# Patient Record
Sex: Female | Born: 1985 | Race: Black or African American | Hispanic: No | Marital: Single | Smoking: Never smoker
Health system: Southern US, Community
[De-identification: ages and names within clinical notes are randomized; demographics above are authoritative.]

## PROBLEM LIST (undated history)

## (undated) DIAGNOSIS — D649 Anemia, unspecified: Secondary | ICD-10-CM

## (undated) DIAGNOSIS — D219 Benign neoplasm of connective and other soft tissue, unspecified: Secondary | ICD-10-CM

## (undated) DIAGNOSIS — L309 Dermatitis, unspecified: Secondary | ICD-10-CM

## (undated) HISTORY — DX: Anemia, unspecified: D64.9

---

## 2006-02-06 ENCOUNTER — Emergency Department (HOSPITAL_COMMUNITY): Admission: EM | Admit: 2006-02-06 | Discharge: 2006-02-06 | Payer: Self-pay | Admitting: Emergency Medicine

## 2006-05-29 ENCOUNTER — Inpatient Hospital Stay (HOSPITAL_COMMUNITY): Admission: AD | Admit: 2006-05-29 | Discharge: 2006-05-29 | Payer: Self-pay | Admitting: Gynecology

## 2006-06-04 ENCOUNTER — Other Ambulatory Visit: Admission: RE | Admit: 2006-06-04 | Discharge: 2006-06-04 | Payer: Self-pay | Admitting: Obstetrics and Gynecology

## 2006-07-08 ENCOUNTER — Ambulatory Visit (HOSPITAL_COMMUNITY): Admission: RE | Admit: 2006-07-08 | Discharge: 2006-07-08 | Payer: Self-pay | Admitting: Obstetrics and Gynecology

## 2006-11-01 ENCOUNTER — Inpatient Hospital Stay (HOSPITAL_COMMUNITY): Admission: AD | Admit: 2006-11-01 | Discharge: 2006-11-04 | Payer: Self-pay | Admitting: Obstetrics & Gynecology

## 2006-11-02 ENCOUNTER — Encounter (INDEPENDENT_AMBULATORY_CARE_PROVIDER_SITE_OTHER): Payer: Self-pay | Admitting: Obstetrics and Gynecology

## 2006-12-15 ENCOUNTER — Other Ambulatory Visit: Admission: RE | Admit: 2006-12-15 | Discharge: 2006-12-15 | Payer: Self-pay | Admitting: Obstetrics and Gynecology

## 2008-05-27 IMAGING — US US OB LIMITED
1 series · 14 of 14 positions shown · non-contrast
Comparison: none

OBSTETRICAL ULTRASOUND:

 This ultrasound exam was performed in the [HOSPITAL] Ultrasound Department.  The OB US report was generated in the AS system, and faxed to the ordering physician.  This report is also available in [REDACTED] PACS.

[Series 1: us ob limited · 0.26mm/px · 14 of 14 slices shown]
[im 1/14]
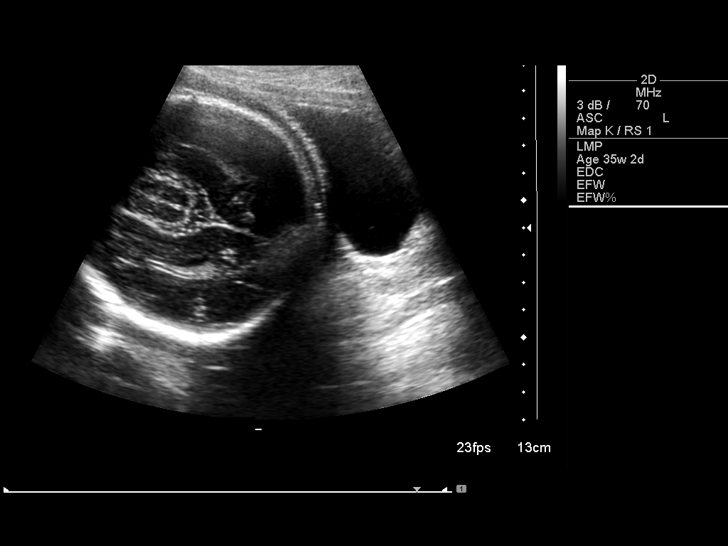
[im 2/14]
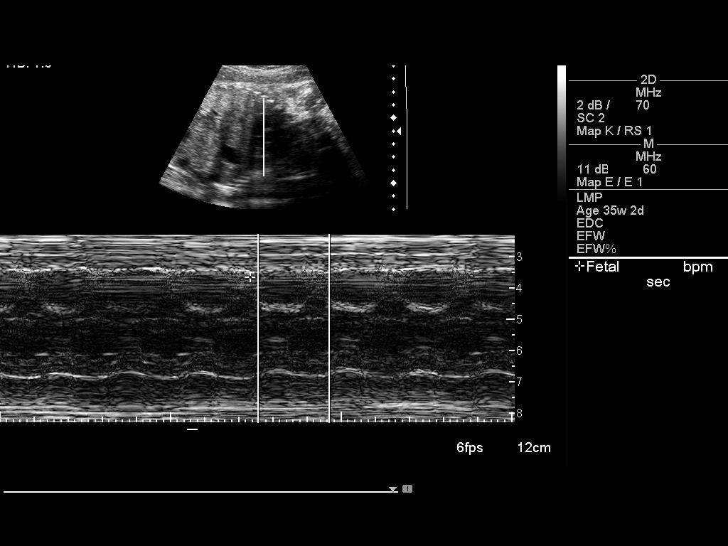
[im 3/14]
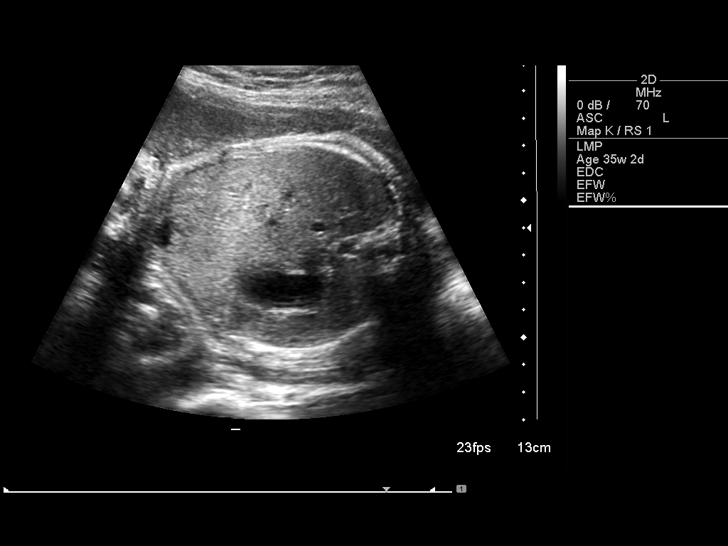
[im 4/14]
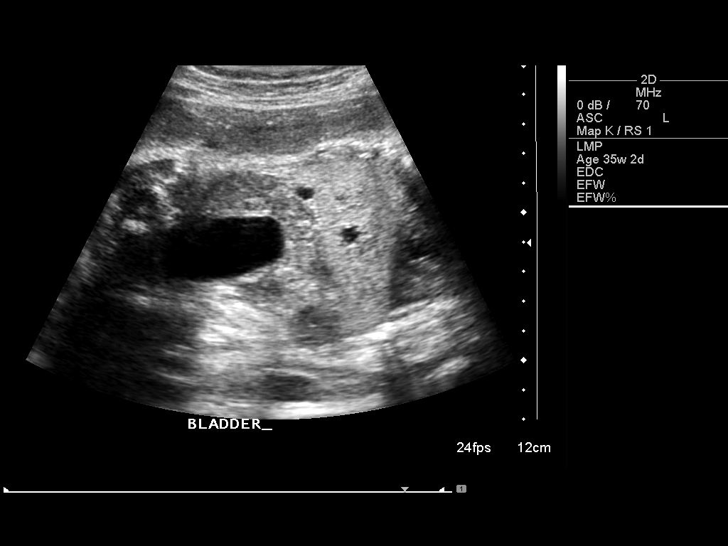
[im 5/14]
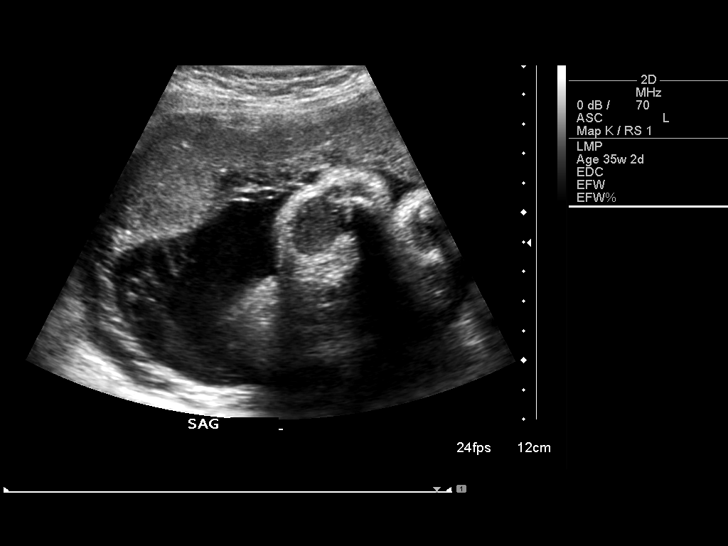
[im 6/14]
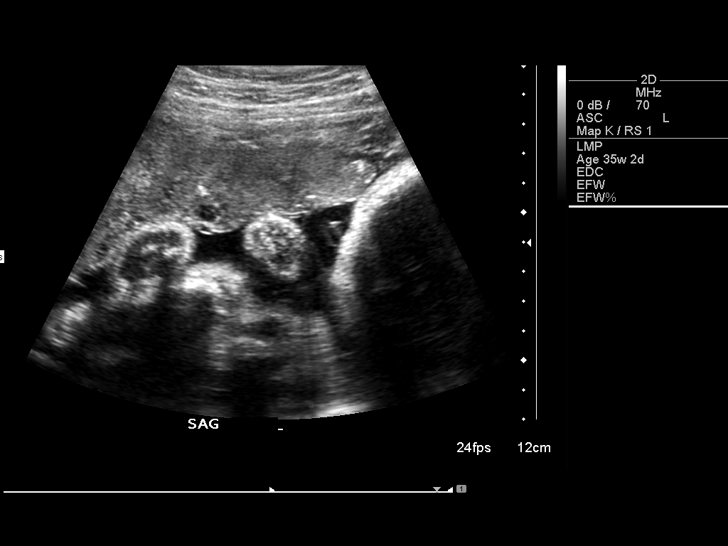
[im 7/14]
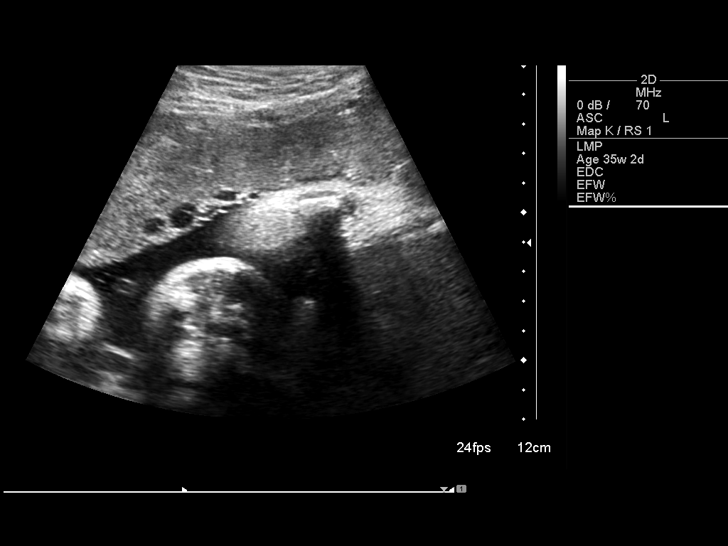
[im 8/14]
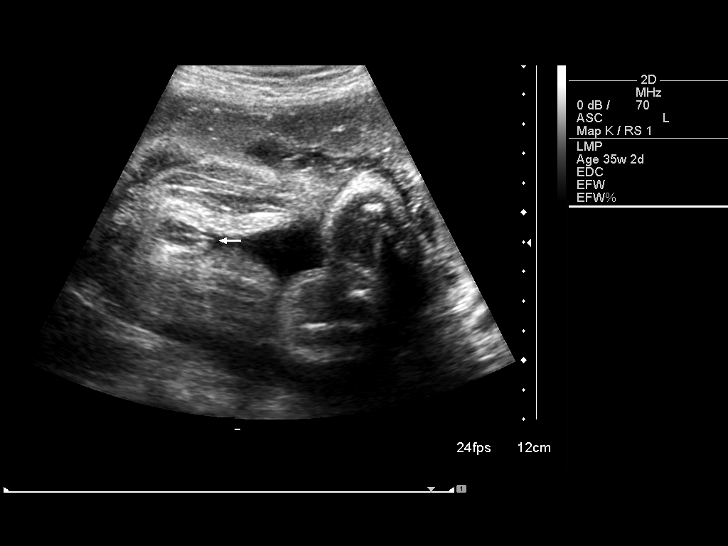
[im 9/14]
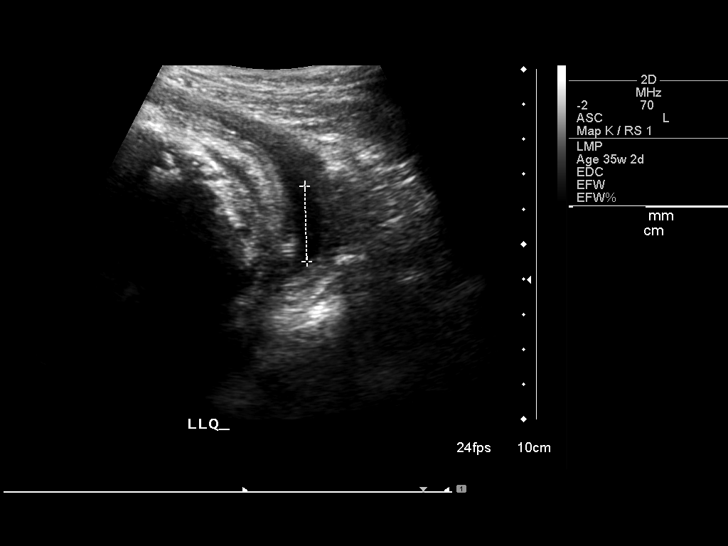
[im 10/14]
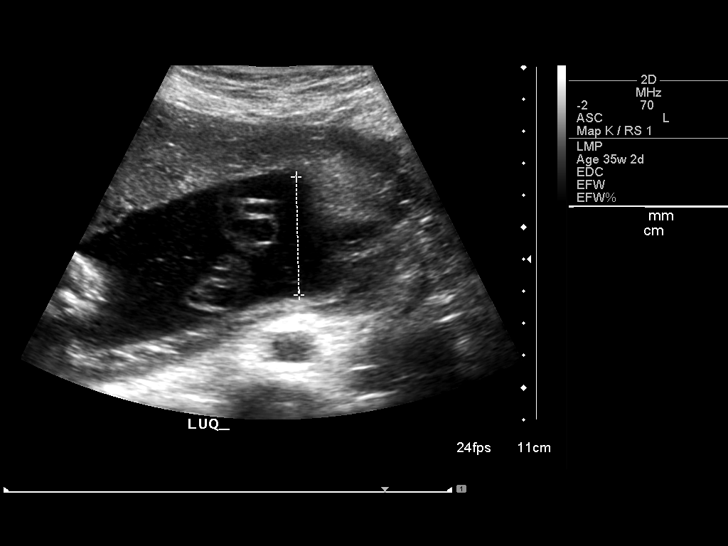
[im 11/14]
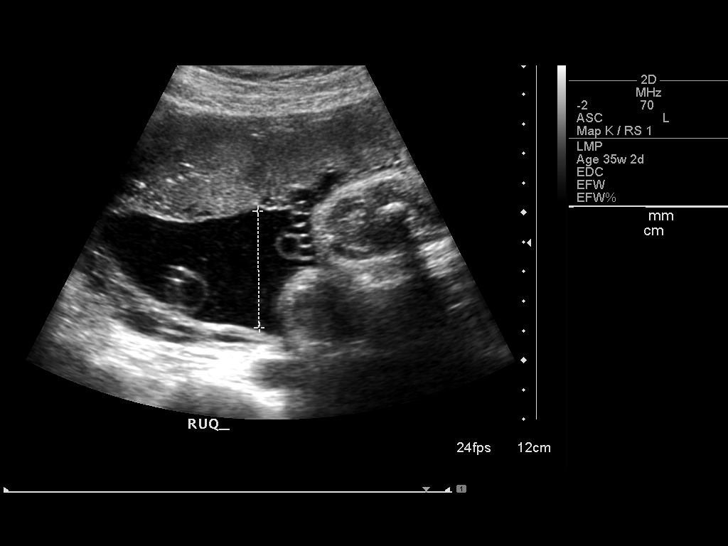
[im 12/14]
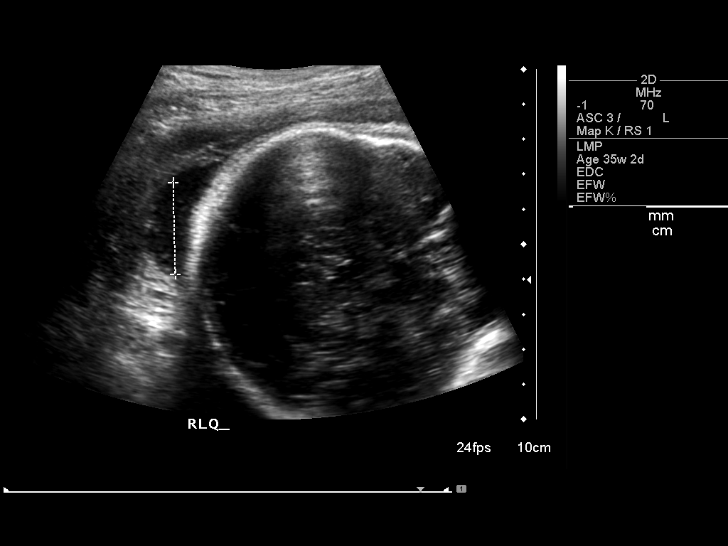
[im 13/14]
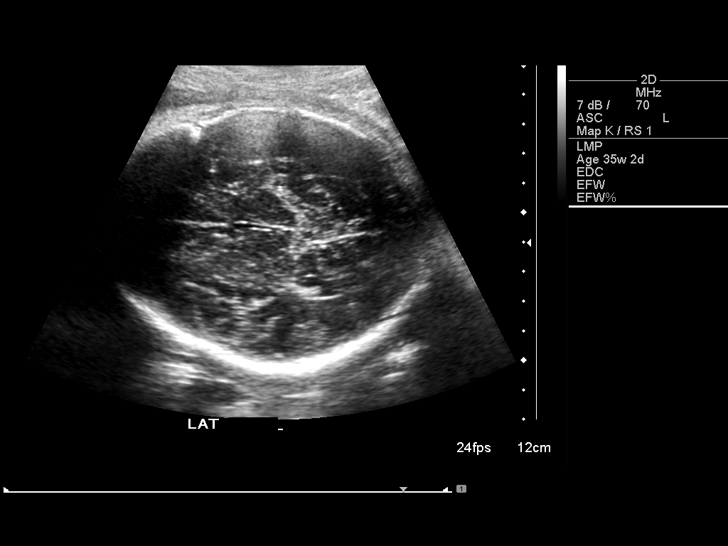
[im 14/14]
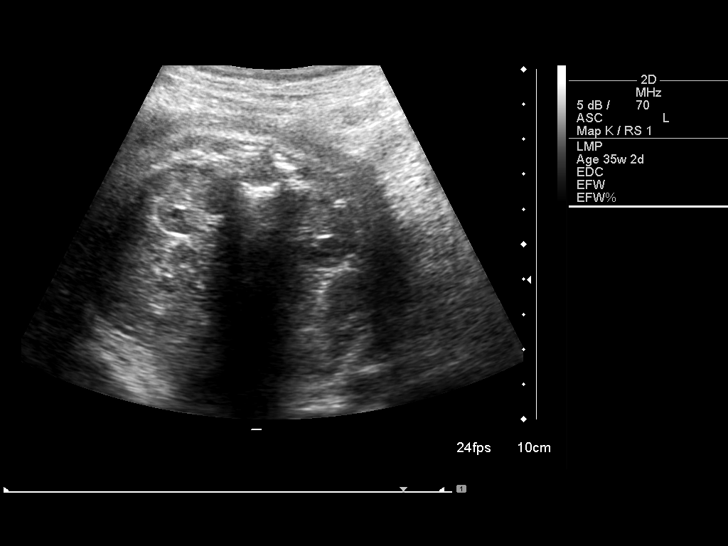

[14 of 14 positions shown; findings below may reference images not displayed]

IMPRESSION: See AS Obstetric US report.

## 2010-07-13 NOTE — Discharge Summary (Signed)
NAME:  Shannon Carpenter, Shannon Carpenter                ACCOUNT NO.:  [id number]   MEDICAL RECORD NO.:  [id number]          PATIENT TYPE:   LOCATION:                                FACILITY:  [facility]   PHYSICIAN:  James A. Ashley Royalty, M.D.     DATE OF BIRTH:   DATE OF ADMISSION:  11/01/2006  DATE OF DISCHARGE:  11/04/2006                               DISCHARGE SUMMARY   DISCHARGE DIAGNOSES:  1. Intrauterine pregnancy at 35 weeks, 1 day gestation, delivered.  2. Spontaneous rupture of membranes.  3. Preterm labor.   OPERATIONS AND SPECIAL PROCEDURES:  OB delivery with repair of second  degree laceration.   CONSULTATIONS:  None.   DISCHARGE MEDICATIONS:  Motrin 600 mg.   HISTORY OF PRESENT ILLNESS:  This is a 25 year old primigravida at 62  weeks, 1 day gestation.  Prenatal care was complicated by first  trimester bleeding which resolved.  The patient presented to maternity  admissions complaining of rupture of membranes at approximately 11 a.m.  preceded by contractions.  GBS status was uncertain.  Examination by the  nurse in maternity admissions revealed fingertip dilatation, minimal  effacement, and high station.  She did confirm ferning.   HOSPITAL COURSE:  The patient was admitted to [facility]  [place].  Admission laboratory studies were drawn.  The patient was  given antibiotic prophylaxis.  On November 01, 2006 at 5 p.m. the  patient was noted to be loose fingertip dilatation.  By 10:35 p.m. she  was noted to be 4 cm dilated.  She was on Pitocin.  On November 02, 2006  I was called for delivery at approximately 12:22 a.m..  I arrived  approximately 12:37 a.m., and the infant had been delivered by teaching  service.  Delivery was accomplished by Dr. Penne Lash with the vacuum  extractor secondary to fetal bradycardia and nonreassuring fetal heart  tracing.  Dr. Penne Lash repaired a second degree laceration as well.  The  infant was a 4 pound 4 ounce female with Apgars of 7 at 1 minute and  8  at 5 minutes.  Sent to newborn nursery.  Arterial cord pH was 7.35.  The  patient's postpartum course was benign.  Her child was taken to the  neonatal intensive care nursery on November 03, 2006.  The patient was  felt to be a candidate for discharge on November 04, 2006 and was  discharged home afebrile and in satisfactory condition.   DISPOSITION:  The patient is to return to [facility] in 4-6 weeks for postpartum evaluation.      James A. Ashley Royalty, M.D.  Electronically Signed     JAM/MEDQ  D:  12/30/2006  T:  12/31/2006  Job:  [id number]

## 2010-12-07 LAB — CBC
Hemoglobin: 11 — ABNORMAL LOW
Hemoglobin: 12
MCHC: 34.3
MCHC: 34.9
MCV: 93
Platelets: 209
RBC: 3.45 — ABNORMAL LOW
RDW: 13.1

## 2010-12-07 LAB — TYPE AND SCREEN: ABO/RH(D): O POS

## 2011-01-03 ENCOUNTER — Encounter: Payer: Self-pay | Admitting: Emergency Medicine

## 2011-01-03 ENCOUNTER — Emergency Department (HOSPITAL_COMMUNITY)
Admission: EM | Admit: 2011-01-03 | Discharge: 2011-01-03 | Disposition: A | Discharge status: Home / Self Care | Payer: Self-pay | Attending: Emergency Medicine | Admitting: Emergency Medicine

## 2011-01-03 DIAGNOSIS — R51 Headache: Secondary | ICD-10-CM | POA: Insufficient documentation

## 2011-01-03 LAB — POCT PREGNANCY, URINE: Preg Test, Ur: NEGATIVE

## 2011-01-03 MED ORDER — CETIRIZINE-PSEUDOEPHEDRINE ER 5-120 MG PO TB12: 1.0000 | ORAL_TABLET | Freq: Every day | ORAL | Status: DC

## 2011-01-03 MED ORDER — LORATADINE 10 MG PO TABS: 10.0000 mg | ORAL_TABLET | Freq: Every day | ORAL | Status: DC

## 2011-01-03 NOTE — ED Notes (Signed)
Patient with headache, starting around 1900 for last three evenings.  Patient states she does have some nausea.  Patient states that the pain is frontal to top of head.

## 2011-01-03 NOTE — ED Notes (Signed)
PT. REPORTS HEADACHE "PRESSURE"  , LIGHTHEADEDNESS AND NAUSEA ONSET YESTERDAY -DENIES INJURY . NO FEVER OR CHILLS.

## 2011-01-03 NOTE — ED Provider Notes (Signed)
History     CSN: [id number] Arrival date & time: 01/03/2011  8:17 PM   None     Chief Complaint  Patient presents with  . Headache    (Consider location/radiation/quality/duration/timing/severity/associated sxs/prior treatment) Patient is a 25 y.o. female presenting with headaches. The history is provided by the patient.  Headache  This is a recurrent problem. The current episode started 1 to 2 hours ago. Episode frequency: daily, around 7pm (at end of work day for patient) The problem has been resolved (Pt took medicine pta and pain resolved). The headache is associated with nothing. The pain is located in the frontal region. The pain is moderate. The pain does not radiate. Pertinent negatives include no fever, no chest pressure, no syncope, no shortness of breath, no nausea and no vomiting. Treatments tried: fioricet. The treatment provided significant relief.    History reviewed. No pertinent past medical history.  History reviewed. No pertinent past surgical history.  No family history on file.  History  Substance Use Topics  . Smoking status: Never Smoker   . Smokeless tobacco: Not on file  . Alcohol Use: Yes     OCCASIONAL    OB History    Grav Para Term Preterm Abortions TAB SAB Ect Mult Living                  Review of Systems  Constitutional: Negative for fever.  Respiratory: Positive for cough (dry cough, only in AM). Negative for shortness of breath.   Cardiovascular: Negative for chest pain and syncope.  Gastrointestinal: Negative for nausea, vomiting, abdominal pain and diarrhea.  Neurological: Positive for headaches.  All other systems reviewed and are negative.    Allergies  Review of patient's allergies indicates no known allergies.  Home Medications   Current Outpatient Rx  Name Route Sig Dispense Refill  . BUTALBITAL-APAP-CAFFEINE 50-325-40 MG PO TABS Oral Take 1 tablet by mouth every 4 (four) hours as needed. For migraines       BP 138/79   Pulse 88  Temp(Src) 98 F (36.7 C) (Oral)  Resp 17  SpO2 100%  Physical Exam  Nursing note and vitals reviewed. Constitutional: She is oriented to person, place, and time. She appears well-developed and well-nourished. No distress.  HENT:  Head: Normocephalic and atraumatic.  Right Ear: External ear normal.  Left Ear: External ear normal.  Mouth/Throat: Oropharynx is clear and moist.       Cobblestoning of posterior pharynx  Eyes: EOM are normal. Pupils are equal, round, and reactive to light.  Neck: Normal range of motion.  Cardiovascular: Normal rate and normal heart sounds.   Pulmonary/Chest: Effort normal and breath sounds normal. No respiratory distress.  Abdominal: Soft. She exhibits no distension. There is no tenderness.  Musculoskeletal: Normal range of motion.  Neurological: She is alert and oriented to person, place, and time.  Skin: Skin is warm and dry.    ED Course  Procedures (including critical care time)   Labs Reviewed  POCT PREGNANCY, URINE   No results found.   1. Headache       MDM  9:12 PM PT seen and examined. Pt with similar headache to what she had about a year ago. She was given Fioricet which does relieve the pain, but it comes back about 24 hours later every day. Concern for possible allergic component as pt with morning cough and cobblestoning of throat. Will start on allergy meds and advise follow up with neurology if no improvement. No  fevers or neck stiffness, so doubt meningitis. Concern for possible cluster headaches, but pain is bilateral. Concern for tension headaches, but do not appear to respond to NSAIDS.         Daleen Bo 01/03/11 2335

## 2011-01-03 NOTE — ED Provider Notes (Signed)
History     CSN: [id number] Arrival date & time: 01/03/2011  8:17 PM   None     Chief Complaint  Patient presents with  . Headache    (Consider location/radiation/quality/duration/timing/severity/associated sxs/prior treatment) HPI  History reviewed. No pertinent past medical history.  History reviewed. No pertinent past surgical history.  No family history on file.  History  Substance Use Topics  . Smoking status: Never Smoker   . Smokeless tobacco: Not on file  . Alcohol Use: Yes     OCCASIONAL    OB History    Grav Para Term Preterm Abortions TAB SAB Ect Mult Living                  Review of Systems  Allergies  Review of patient's allergies indicates no known allergies.  Home Medications   Current Outpatient Rx  Name Route Sig Dispense Refill  . BUTALBITAL-APAP-CAFFEINE 50-325-40 MG PO TABS Oral Take 1 tablet by mouth every 4 (four) hours as needed. For migraines       BP 138/79  Pulse 88  Temp(Src) 98 F (36.7 C) (Oral)  Resp 17  SpO2 100%  Physical Exam  ED Course  Procedures (including critical care time)  Labs Reviewed - No data to display No results found.   No diagnosis found.    MDM  I saw and evaluated the patient, reviewed the resident's note and I agree with the findings and plan.         Tata Timmins A. Patrica Duel, MD 01/03/11 2110

## 2011-01-06 NOTE — ED Provider Notes (Signed)
I saw and evaluated the patient, reviewed the resident's note and I agree with the findings and plan.  Camar Guyton A. Patrica Duel, MD 01/06/11 [phone]

## 2012-06-30 ENCOUNTER — Ambulatory Visit: Payer: Self-pay | Admitting: Emergency Medicine

## 2012-06-30 VITALS — BP 128/84 | HR 118 | Temp 99.2°F | Resp 17 | Ht 63.0 in | Wt 173.0 lb

## 2012-06-30 DIAGNOSIS — J029 Acute pharyngitis, unspecified: Secondary | ICD-10-CM

## 2012-06-30 MED ORDER — PENICILLIN V POTASSIUM 500 MG PO TABS: 500.0000 mg | ORAL_TABLET | Freq: Four times a day (QID) | ORAL | Status: DC

## 2012-06-30 NOTE — Patient Instructions (Addendum)
Safer Sex Your caregiver wants you to have this information about the infections that can be transmitted from sexual contact and how to prevent them. The idea behind safer sex is that you can be sexually active, and at the same time reduce the risk of giving or getting a sexually transmitted disease (STD). Every person should be aware of how to prevent him or herself and his or her sex partner from getting an STD. CAUSES OF STDS STDs are transmitted by sharing body fluids, which contain viruses and bacteria. The following fluids all transmit infections during sexual intercourse and sex acts:  Semen.  Saliva.  Urine.  Blood.  Vaginal mucus. Examples of STDs include:  Chlamydia.  Gonorrhea.  Genital herpes.  Hepatitis B.  Human immunodeficiency virus or acquired immunodeficiency syndrome (HIV or AIDS).  Syphilis.  Trichomonas.  Pubic lice.  Human papillomavirus (HPV), which may include:  Genital warts.  Cervical dysplasia.  Cervical cancer (can develop with certain types of HPV). SYMPTOMS  Sexual diseases often cause few or no symptoms until they are advanced, so a person can be infected and spread the infection without knowing it. Some STDs respond to treatment very well. Others, like HIV and herpes, cannot be cured, but are treated to reduce their effects. Specific symptoms include:  Abnormal vaginal discharge.  Irritation or itching in and around the vagina, and in the pubic hair.  Pain during sexual intercourse.  Bleeding during sexual intercourse.  Pelvic or abdominal pain.  Fever.  Growths in and around the vagina.  An ulcer in or around the vagina.  Swollen glands in the groin area. DIAGNOSIS   Blood tests.  Pap test.  Culture test of abnormal vaginal discharge.  A test that applies a solution and examines the cervix with a lighted magnifying scope (colposcopy).  A test that examines the pelvis with a lighted tube, through a small incision  (laparoscopy). TREATMENT  The treatment will depend on the cause of the STD.  Antibiotic treatment by injection, oral, creams, or suppositories in the vagina.  Over-the-counter medicated shampoo, to get rid of pubic lice.  Removing or treating growths with medicine, freezing, burning (electrocautery), or surgery.  Surgery treatment for HPV of the cervix.  Supportive medicines for herpes, HIV, AIDS, and hepatitis. Being careful cannot eliminate all risk of infection, but sex can be made much safer. Safe sexual practices include body massage and gentle touching. Masturbation is safe, as long as body fluids do not contact skin that has sores or cuts. Dry kissing and oral sex on a man wearing a latex condom or on a woman wearing a female condom is also safe. Slightly less safe is intercourse while the man wears a latex condom or wet kissing. It is also safer to have one sex partner that you know is not having sex with anyone else. LENGTH OF ILLNESS An STD might be treated and cured in a week, sometimes a month, or more. And it can linger with symptoms for many years. STDs can also cause damage to the female organs. This can cause chronic pain, infertility, and recurrence of the STD, especially herpes, hepatitis, HIV, and HPV. HOME CARE INSTRUCTIONS AND PREVENTION  Alcohol and recreational drugs are often the reason given for not practicing safer sex. These substances affect your judgment. Alcohol and recreational drugs can also impair your immune system, making you more vulnerable to disease.  Do not engage in risky and dangerous sexual practices, including:  Vaginal or anal sex without a  condom.  Oral sex on a man without a condom.  Oral sex on a woman without a female condom.  Using saliva to lubricate a condom.  Any other sexual contact in which body fluids or blood from one partner contact the other partner.  You should use only latex condoms for men and water soluble lubricants.  Petroleum based lubricants or oils used to lubricate a condom will weaken the condom and increase the chance that it will break.  Think very carefully before having sex with anyone who is high risk for STDs and HIV. This includes IV drug users, people with multiple sexual partners, or people who have had an STD, or a positive hepatitis or HIV blood test.  Remember that even if your partner has had only one previous partner, their previous partner might have had multiple partners. If so, you are at high risk of being exposed to an STD. You and your sex partner should be the only sex partners with each other, with no one else involved.  A vaccine is available for hepatitis B and HPV through your caregiver or the Public Health Department. Everyone should be vaccinated with these vaccines.  Avoid risky sex practices. Sex acts that can break the skin make you more likely to get an STD. SEEK MEDICAL CARE IF:   If you think you have an STD, even if you do not have any symptoms. Contact your caregiver for evaluation and treatment, if needed.  You think or know your sex partner has acquired an STD.  You have any of the symptoms mentioned above. Document Released: 03/21/2004 Document Revised: 05/06/2011 Document Reviewed: 01/11/2009 [facility] Patient Information 2013 [place], [place]. Strep Infections Streptococcal (strep) infections are caused by streptococcal germs (bacteria). Strep infections are very contagious. Strep infections can occur in:  Ears.  The nose.  The throat.  Sinuses.  Skin.  Blood.  Lungs.  Spinal fluid.  Urine. Strep throat is the most common bacterial infection in children. The symptoms of a Strep infection usually get better in 2 to 3 days after starting medicine that kills germs (antibiotics). Strep is usually not contagious after 36 to 48 hours of antibiotic treatment. Strep infections that are not treated can cause serious complications. These include gland  infections, throat abscess, rheumatic fever and kidney disease. DIAGNOSIS  The diagnosis of strep is made by:  A culture for the strep germ. TREATMENT  These infections require oral antibiotics for a full 10 days, an antibiotic shot or antibiotics given into the vein (intravenous, IV). HOME CARE INSTRUCTIONS   Be sure to finish all antibiotics even if feeling better.  Only take over-the-counter medicines for pain, discomfort and or fever, as directed by your caregiver.  Close contacts that have a fever, sore throat or illness symptoms should see their caregiver right away.  You or your child may return to work, school or daycare if the fever and pain are better in 2 to 3 days after starting antibiotics. SEEK MEDICAL CARE IF:   You or your child has an oral temperature above 102 F (38.9 C).  Your baby is older than 3 months with a rectal temperature of 100.5 F (38.1 C) or higher for more than 1 day.  You or your child is not better in 3 days. SEEK IMMEDIATE MEDICAL CARE IF:   You or your child has an oral temperature above 102 F (38.9 C), not controlled by medicine.  Your baby is older than 3 months with a rectal temperature  of 102 F (38.9 C) or higher.  Your baby is 29 months old or younger with a rectal temperature of 100.4 F (38 C) or higher.  There is a spreading rash.  There is difficulty swallowing or breathing.  There is increased pain or swelling. Document Released: 03/21/2004 Document Revised: 05/06/2011 Document Reviewed: 12/28/2008 [facility] Patient Information 2013 [place], [place].

## 2012-06-30 NOTE — Progress Notes (Signed)
[facility] [address], [place] [address] [phone]  Date:  06/30/2012   Name:  Shannon Carpenter   DOB:  1985-09-12   MRN:  [id number]  PCP:  No PCP Per Patient    Chief Complaint: Exposure to STD and Sore Throat   History of Present Illness:  Shannon Carpenter is a 27 y.o. very pleasant female patient who presents with the following:  Says that after having oral sex over the past two weeks she found that she has a sore throat and looked on the internet and thinks she has oropharyngeal gonorrhea.  No fever or chills.  No history of STD in past.  No vaginal discharge or dyspareunia.  No improvement with over the counter medications or other home remedies. Denies other complaint or health concern today.   There are no active problems to display for this patient.   Past Medical History  Diagnosis Date  . Anemia     History reviewed. No pertinent past surgical history.  History  Substance Use Topics  . Smoking status: Never Smoker   . Smokeless tobacco: Not on file  . Alcohol Use: Yes     Comment: OCCASIONAL    History reviewed. No pertinent family history.  No Known Allergies  Medication list has been reviewed and updated.  No current outpatient prescriptions on file prior to visit.   No current facility-administered medications on file prior to visit.    Review of Systems:  As per HPI, otherwise negative.    Physical Examination: Filed Vitals:   06/30/12 1831  BP: 128/84  Pulse: 118  Temp: 99.2 F (37.3 C)  Resp: 17   Filed Vitals:   06/30/12 1831  Height: 5\' 3"  (1.6 m)  Weight: 173 lb (78.472 kg)   Body mass index is 30.65 kg/(m^2). Ideal Body Weight: Weight in (lb) to have BMI = 25: 140.8  GEN: WDWN, NAD, Non-toxic, A & O x 3 HEENT: Atraumatic, Normocephalic. Neck supple. No masses, No LAD.  Exudative tonsillitis Ears and Nose: No external deformity. CV: RRR, No M/G/R. No JVD. No thrill. No extra heart sounds. PULM: CTA  B, no wheezes, crackles, rhonchi. No retractions. No resp. distress. No accessory muscle use. ABD: S, NT, ND, +BS. No rebound. No HSM. EXTR: No c/c/e NEURO Normal gait.  PSYCH: Normally interactive. Conversant. Not depressed or anxious appearing.  Calm demeanor.    Assessment and Plan: Tonsillitis Pen v k   Signed,  Phillips Odor, MD

## 2012-07-03 ENCOUNTER — Telehealth: Payer: Self-pay

## 2012-07-03 NOTE — Telephone Encounter (Signed)
She was advised of negative results.

## 2012-07-03 NOTE — Telephone Encounter (Signed)
Patient calling back for lab results, says she was just on the phone with someone in lab. Please call back at: [phone]

## 2012-07-04 LAB — GONOCOCCUS CULTURE: Organism ID, Bacteria: NO GROWTH

## 2017-08-25 ENCOUNTER — Other Ambulatory Visit: Payer: Self-pay | Admitting: Obstetrics & Gynecology

## 2017-09-03 NOTE — Patient Instructions (Addendum)
Your procedure is scheduled on: Monday, July 22  Enter through the Main Entrance of [facility] at: 11:15 am  Pick up the phone at the desk and dial [phone].  Call this number if you have problems the morning of surgery: [phone].  Remember: Do NOT eat food or Do NOT drink clear liquids (including water) after midnight Sunday   Take these medicines the morning of surgery with a SIP OF WATER: None  Bring inhaler with you on day of surgery.  Brush your teeth on the morning of surgery.  Do Not smoke on the day of surgery.  Stop herbal medications and supplements at this time.  Do NOT wear jewelry (body piercing), metal hair clips/bobby pins, make-up, or nail polish. Do NOT wear lotions, powders, or perfumes.  You may wear deoderant. Do NOT shave for 48 hours prior to surgery. Do NOT bring valuables to the hospital. Contacts, dentures, or bridgework may not be worn into surgery.  Leave suitcase in car.  After surgery it may be brought to your room.    For patients admitted to the hospital, checkout time is 11:00 AM the day of discharge.

## 2017-09-12 ENCOUNTER — Encounter (HOSPITAL_COMMUNITY): Payer: Self-pay

## 2017-09-12 ENCOUNTER — Other Ambulatory Visit: Payer: Self-pay

## 2017-09-12 ENCOUNTER — Encounter (HOSPITAL_COMMUNITY)
Admission: RE | Admit: 2017-09-12 | Discharge: 2017-09-12 | Disposition: A | Discharge status: Home / Self Care | Payer: BC Managed Care – PPO | Source: Ambulatory Visit | Attending: Obstetrics & Gynecology | Admitting: Obstetrics & Gynecology

## 2017-09-12 DIAGNOSIS — Z01812 Encounter for preprocedural laboratory examination: Secondary | ICD-10-CM | POA: Insufficient documentation

## 2017-09-12 LAB — BASIC METABOLIC PANEL
ANION GAP: 10 (ref 5–15)
BUN: 12 mg/dL (ref 6–20)
CO2: 24 mmol/L (ref 22–32)
Calcium: 9.3 mg/dL (ref 8.9–10.3)
Chloride: 102 mmol/L (ref 98–111)
Creatinine, Ser: 0.69 mg/dL (ref 0.44–1.00)
GFR calc Af Amer: >60 mL/min (ref >60)
Glucose, Bld: 96 mg/dL (ref 70–99)
POTASSIUM: 4.5 mmol/L (ref 3.5–5.1)
SODIUM: 136 mmol/L (ref 135–145)

## 2017-09-12 LAB — CBC
HEMATOCRIT: 35.7 % — AB (ref 36.0–46.0)
HEMOGLOBIN: 11.6 g/dL — AB (ref 12.0–15.0)
MCH: 27.8 pg (ref 26.0–34.0)
MCHC: 32.5 g/dL (ref 30.0–36.0)
MCV: 85.4 fL (ref 78.0–100.0)
Platelets: 304 10*3/uL (ref 150–400)
RBC: 4.18 MIL/uL (ref 3.87–5.11)
RDW: 18.9 % — AB (ref 11.5–15.5)
WBC: 4.3 10*3/uL (ref 4.0–10.5)

## 2017-09-12 LAB — TYPE AND SCREEN
ABO/RH(D): O POS
Antibody Screen: NEGATIVE

## 2017-09-15 ENCOUNTER — Inpatient Hospital Stay (HOSPITAL_COMMUNITY): Payer: BC Managed Care – PPO | Admitting: Certified Registered Nurse Anesthetist

## 2017-09-15 ENCOUNTER — Observation Stay (HOSPITAL_COMMUNITY)
Admission: RE | Admit: 2017-09-15 | Discharge: 2017-09-16 | Disposition: A | Discharge status: Home / Self Care | Payer: BC Managed Care – PPO | Source: Ambulatory Visit | Attending: Obstetrics & Gynecology | Admitting: Obstetrics & Gynecology

## 2017-09-15 ENCOUNTER — Other Ambulatory Visit: Payer: Self-pay

## 2017-09-15 ENCOUNTER — Encounter (HOSPITAL_COMMUNITY)
Admission: RE | Disposition: A | Discharge status: Home / Self Care | Payer: Self-pay | Source: Ambulatory Visit | Attending: Obstetrics & Gynecology

## 2017-09-15 DIAGNOSIS — D649 Anemia, unspecified: Secondary | ICD-10-CM | POA: Insufficient documentation

## 2017-09-15 DIAGNOSIS — D259 Leiomyoma of uterus, unspecified: Secondary | ICD-10-CM | POA: Insufficient documentation

## 2017-09-15 DIAGNOSIS — D252 Subserosal leiomyoma of uterus: Secondary | ICD-10-CM | POA: Diagnosis not present

## 2017-09-15 DIAGNOSIS — Z79899 Other long term (current) drug therapy: Secondary | ICD-10-CM | POA: Diagnosis not present

## 2017-09-15 DIAGNOSIS — Z9889 Other specified postprocedural states: Secondary | ICD-10-CM

## 2017-09-15 HISTORY — PX: MYOMECTOMY: SHX85

## 2017-09-15 LAB — PREGNANCY, URINE: Preg Test, Ur: NEGATIVE

## 2017-09-15 SURGERY — MYOMECTOMY, ABDOMINAL APPROACH
Anesthesia: General | Site: Abdomen

## 2017-09-15 MED ORDER — CEFAZOLIN SODIUM-DEXTROSE 2-4 GM/100ML-% IV SOLN
INTRAVENOUS | Status: AC
  Filled 2017-09-15: qty 100

## 2017-09-15 MED ORDER — SCOPOLAMINE 1 MG/3DAYS TD PT72
MEDICATED_PATCH | TRANSDERMAL | Status: AC
  Administered 2017-09-15: 1.5 mg via TRANSDERMAL
  Filled 2017-09-15: qty 1

## 2017-09-15 MED ORDER — ROCURONIUM BROMIDE 100 MG/10ML IV SOLN
INTRAVENOUS | Status: DC | PRN
  Administered 2017-09-15: 60 mg via INTRAVENOUS

## 2017-09-15 MED ORDER — MENTHOL 3 MG MT LOZG: 1.0000 | LOZENGE | OROMUCOSAL | Status: DC | PRN

## 2017-09-15 MED ORDER — PROMETHAZINE HCL 25 MG/ML IJ SOLN: 6.2500 mg | INTRAMUSCULAR | Status: DC | PRN

## 2017-09-15 MED ORDER — SODIUM CHLORIDE 0.9% FLUSH: 9.0000 mL | INTRAVENOUS | Status: DC | PRN

## 2017-09-15 MED ORDER — ONDANSETRON HCL 4 MG/2ML IJ SOLN: 4.0000 mg | Freq: Four times a day (QID) | INTRAMUSCULAR | Status: DC | PRN

## 2017-09-15 MED ORDER — NALOXONE HCL 0.4 MG/ML IJ SOLN: 0.4000 mg | INTRAMUSCULAR | Status: DC | PRN

## 2017-09-15 MED ORDER — GLYCOPYRROLATE 0.2 MG/ML IJ SOLN
INTRAMUSCULAR | Status: AC
  Filled 2017-09-15: qty 1

## 2017-09-15 MED ORDER — GLYCOPYRROLATE 0.2 MG/ML IJ SOLN
INTRAMUSCULAR | Status: DC | PRN
  Administered 2017-09-15 (×2): 0.1 mg via INTRAVENOUS

## 2017-09-15 MED ORDER — HYDROMORPHONE HCL 1 MG/ML IJ SOLN
INTRAMUSCULAR | Status: AC
  Filled 2017-09-15: qty 1

## 2017-09-15 MED ORDER — VASOPRESSIN 20 UNIT/ML IV SOLN
INTRAVENOUS | Status: DC | PRN
  Administered 2017-09-15: 60 [IU]

## 2017-09-15 MED ORDER — SODIUM CHLORIDE BACTERIOSTATIC 0.9 % IJ SOLN
INTRAMUSCULAR | Status: DC | PRN
  Administered 2017-09-15: 90 mL

## 2017-09-15 MED ORDER — BUPIVACAINE HCL (PF) 0.25 % IJ SOLN
INTRAMUSCULAR | Status: DC | PRN
  Administered 2017-09-15: 20 mL
  Administered 2017-09-15: 10 mL

## 2017-09-15 MED ORDER — DEXAMETHASONE SODIUM PHOSPHATE 10 MG/ML IJ SOLN
INTRAMUSCULAR | Status: DC | PRN
  Administered 2017-09-15: 10 mg via INTRAVENOUS

## 2017-09-15 MED ORDER — LACTATED RINGERS IV SOLN
INTRAVENOUS | Status: DC
  Administered 2017-09-15 (×2): via INTRAVENOUS
  Administered 2017-09-15: 125 mL/h via INTRAVENOUS

## 2017-09-15 MED ORDER — MIDAZOLAM HCL 2 MG/2ML IJ SOLN: 0.5000 mg | Freq: Once | INTRAMUSCULAR | Status: DC | PRN

## 2017-09-15 MED ORDER — HYDROMORPHONE HCL 1 MG/ML IJ SOLN
0.2500 mg | INTRAMUSCULAR | Status: DC | PRN
  Administered 2017-09-15 (×2): 0.5 mg via INTRAVENOUS

## 2017-09-15 MED ORDER — LIDOCAINE HCL (CARDIAC) PF 100 MG/5ML IV SOSY
PREFILLED_SYRINGE | INTRAVENOUS | Status: AC
  Filled 2017-09-15: qty 5

## 2017-09-15 MED ORDER — ONDANSETRON HCL 4 MG PO TABS: 4.0000 mg | ORAL_TABLET | Freq: Four times a day (QID) | ORAL | Status: DC | PRN

## 2017-09-15 MED ORDER — SUGAMMADEX SODIUM 200 MG/2ML IV SOLN
INTRAVENOUS | Status: DC | PRN
  Administered 2017-09-15: 200 mg via INTRAVENOUS

## 2017-09-15 MED ORDER — KETOROLAC TROMETHAMINE 30 MG/ML IJ SOLN: 30.0000 mg | Freq: Once | INTRAMUSCULAR | Status: DC

## 2017-09-15 MED ORDER — FENTANYL CITRATE (PF) 250 MCG/5ML IJ SOLN
INTRAMUSCULAR | Status: AC
  Filled 2017-09-15: qty 5

## 2017-09-15 MED ORDER — MIDAZOLAM HCL 2 MG/2ML IJ SOLN
INTRAMUSCULAR | Status: AC
  Filled 2017-09-15: qty 2

## 2017-09-15 MED ORDER — SCOPOLAMINE 1 MG/3DAYS TD PT72
1.0000 | MEDICATED_PATCH | Freq: Once | TRANSDERMAL | Status: DC
  Administered 2017-09-15: 1.5 mg via TRANSDERMAL

## 2017-09-15 MED ORDER — SUGAMMADEX SODIUM 200 MG/2ML IV SOLN
INTRAVENOUS | Status: AC
  Filled 2017-09-15: qty 2

## 2017-09-15 MED ORDER — FENTANYL CITRATE (PF) 100 MCG/2ML IJ SOLN
INTRAMUSCULAR | Status: DC | PRN
  Administered 2017-09-15: 50 ug via INTRAVENOUS
  Administered 2017-09-15 (×2): 100 ug via INTRAVENOUS

## 2017-09-15 MED ORDER — ONDANSETRON HCL 4 MG/2ML IJ SOLN
INTRAMUSCULAR | Status: DC | PRN
  Administered 2017-09-15 (×2): 2 mg via INTRAVENOUS

## 2017-09-15 MED ORDER — DIPHENHYDRAMINE HCL 12.5 MG/5ML PO ELIX: 12.5000 mg | ORAL_SOLUTION | Freq: Four times a day (QID) | ORAL | Status: DC | PRN

## 2017-09-15 MED ORDER — MIDAZOLAM HCL 2 MG/2ML IJ SOLN
INTRAMUSCULAR | Status: DC | PRN
  Administered 2017-09-15: 1.5 mg via INTRAVENOUS
  Administered 2017-09-15: 0.5 mg via INTRAVENOUS

## 2017-09-15 MED ORDER — ONDANSETRON HCL 4 MG/2ML IJ SOLN
INTRAMUSCULAR | Status: AC
  Filled 2017-09-15: qty 2

## 2017-09-15 MED ORDER — HYDROMORPHONE 1 MG/ML IV SOLN
INTRAVENOUS | Status: DC
  Administered 2017-09-15: 17:00:00 via INTRAVENOUS
  Administered 2017-09-16: 0.2 mg via INTRAVENOUS
  Administered 2017-09-16: 0.06 mg via INTRAVENOUS
  Filled 2017-09-15: qty 25

## 2017-09-15 MED ORDER — HYDROMORPHONE HCL 1 MG/ML IJ SOLN
INTRAMUSCULAR | Status: DC | PRN
  Administered 2017-09-15 (×2): 1 mg via INTRAVENOUS

## 2017-09-15 MED ORDER — PROPOFOL 10 MG/ML IV BOLUS
INTRAVENOUS | Status: AC
  Filled 2017-09-15: qty 20

## 2017-09-15 MED ORDER — KETOROLAC TROMETHAMINE 30 MG/ML IJ SOLN
INTRAMUSCULAR | Status: DC | PRN
  Administered 2017-09-15: 30 mg via INTRAVENOUS

## 2017-09-15 MED ORDER — DEXAMETHASONE SODIUM PHOSPHATE 10 MG/ML IJ SOLN
INTRAMUSCULAR | Status: AC
  Filled 2017-09-15: qty 1

## 2017-09-15 MED ORDER — IBUPROFEN 600 MG PO TABS
600.0000 mg | ORAL_TABLET | Freq: Four times a day (QID) | ORAL | Status: DC | PRN
Start: 2017-09-15 — End: 2017-09-16
  Administered 2017-09-16: 600 mg via ORAL
  Filled 2017-09-15: qty 1

## 2017-09-15 MED ORDER — LIDOCAINE HCL (CARDIAC) PF 100 MG/5ML IV SOSY
PREFILLED_SYRINGE | INTRAVENOUS | Status: DC | PRN
  Administered 2017-09-15: 40 mg via INTRAVENOUS

## 2017-09-15 MED ORDER — VASOPRESSIN 20 UNIT/ML IV SOLN
INTRAVENOUS | Status: AC
  Filled 2017-09-15: qty 3

## 2017-09-15 MED ORDER — PROPOFOL 10 MG/ML IV BOLUS
INTRAVENOUS | Status: DC | PRN
  Administered 2017-09-15: 200 mg via INTRAVENOUS

## 2017-09-15 MED ORDER — BUPIVACAINE HCL (PF) 0.25 % IJ SOLN
INTRAMUSCULAR | Status: AC
  Filled 2017-09-15: qty 30

## 2017-09-15 MED ORDER — OXYCODONE-ACETAMINOPHEN 5-325 MG PO TABS
1.0000 | ORAL_TABLET | Freq: Four times a day (QID) | ORAL | Status: DC | PRN
  Administered 2017-09-16 (×2): 1 via ORAL
  Filled 2017-09-15 (×2): qty 1

## 2017-09-15 MED ORDER — SODIUM CHLORIDE 0.9 % IJ SOLN
INTRAMUSCULAR | Status: AC
  Filled 2017-09-15: qty 100

## 2017-09-15 MED ORDER — CEFAZOLIN SODIUM-DEXTROSE 2-4 GM/100ML-% IV SOLN
2.0000 g | INTRAVENOUS | Status: AC
  Administered 2017-09-15: 2 g via INTRAVENOUS

## 2017-09-15 MED ORDER — ROCURONIUM BROMIDE 100 MG/10ML IV SOLN
INTRAVENOUS | Status: AC
  Filled 2017-09-15: qty 1

## 2017-09-15 MED ORDER — DIPHENHYDRAMINE HCL 50 MG/ML IJ SOLN: 12.5000 mg | Freq: Four times a day (QID) | INTRAMUSCULAR | Status: DC | PRN

## 2017-09-15 MED ORDER — MEPERIDINE HCL 25 MG/ML IJ SOLN: 6.2500 mg | INTRAMUSCULAR | Status: DC | PRN

## 2017-09-15 MED ORDER — KETOROLAC TROMETHAMINE 30 MG/ML IJ SOLN
INTRAMUSCULAR | Status: AC
  Filled 2017-09-15: qty 1

## 2017-09-15 SURGICAL SUPPLY — 41 items
BARRIER ADHS 3X4 INTERCEED (GAUZE/BANDAGES/DRESSINGS) IMPLANT
BRR ADH 4X3 ABS CNTRL BYND (GAUZE/BANDAGES/DRESSINGS)
CANISTER SUCT 3000ML PPV (MISCELLANEOUS) ×3 IMPLANT
CELLS DAT CNTRL 66122 CELL SVR (MISCELLANEOUS) ×1 IMPLANT
CONT SPEC PATH 64OZ SNAP LID (MISCELLANEOUS) ×3 IMPLANT
DECANTER SPIKE VIAL GLASS SM (MISCELLANEOUS) ×3 IMPLANT
DRAPE CESAREAN BIRTH W POUCH (DRAPES) ×3 IMPLANT
DRSG OPSITE POSTOP 4X10 (GAUZE/BANDAGES/DRESSINGS) ×2 IMPLANT
DURAPREP 26ML APPLICATOR (WOUND CARE) ×3 IMPLANT
ELECT NDL TIP 2.8 STRL (NEEDLE) IMPLANT
ELECT NEEDLE TIP 2.8 STRL (NEEDLE) ×3 IMPLANT
FILTER STRAW FLUID ASPIR (MISCELLANEOUS) IMPLANT
GAUZE SPONGE 4X4 16PLY XRAY LF (GAUZE/BANDAGES/DRESSINGS) ×3 IMPLANT
GLOVE BIO SURGEON STRL SZ7 (GLOVE) ×3 IMPLANT
GLOVE BIOGEL PI IND STRL 7.0 (GLOVE) ×2 IMPLANT
GLOVE BIOGEL PI INDICATOR 7.0 (GLOVE) ×4
GOWN STRL REUS W/TWL LRG LVL3 (GOWN DISPOSABLE) ×9 IMPLANT
NEEDLE HYPO 22GX1.5 SAFETY (NEEDLE) ×6 IMPLANT
NS IRRIG 1000ML POUR BTL (IV SOLUTION) ×3 IMPLANT
PACK ABDOMINAL GYN (CUSTOM PROCEDURE TRAY) ×3 IMPLANT
PAD OB MATERNITY 4.3X12.25 (PERSONAL CARE ITEMS) ×3 IMPLANT
PROTECTOR NERVE ULNAR (MISCELLANEOUS) ×3 IMPLANT
RETRACTOR WND ALEXIS 18 MED (MISCELLANEOUS) IMPLANT
RTRCTR WOUND ALEXIS 18CM MED (MISCELLANEOUS) ×3
STAPLER VISISTAT 35W (STAPLE) IMPLANT
SUT MON AB 2-0 CT1 27 (SUTURE) ×1 IMPLANT
SUT MON AB 2-0 SH 27 (SUTURE) ×9
SUT MON AB 2-0 SH27 (SUTURE) ×1 IMPLANT
SUT MON AB 3-0 SH 27 (SUTURE)
SUT MON AB 3-0 SH27 (SUTURE) IMPLANT
SUT PLAIN 2 0 (SUTURE) ×3
SUT PLAIN ABS 2-0 CT1 27XMFL (SUTURE) IMPLANT
SUT VIC AB 0 CT1 27 (SUTURE) ×21
SUT VIC AB 0 CT1 27XBRD ANBCTR (SUTURE) ×2 IMPLANT
SUT VIC AB 0 CT1 36 (SUTURE) IMPLANT
SUT VIC AB 2-0 CT1 27 (SUTURE) ×3
SUT VIC AB 2-0 CT1 TAPERPNT 27 (SUTURE) IMPLANT
SUT VIC AB 4-0 KS 27 (SUTURE) ×3 IMPLANT
SYR CONTROL 10ML LL (SYRINGE) ×6 IMPLANT
TOWEL OR 17X24 6PK STRL BLUE (TOWEL DISPOSABLE) ×6 IMPLANT
TRAY FOLEY W/BAG SLVR 14FR (SET/KITS/TRAYS/PACK) ×3 IMPLANT

## 2017-09-15 NOTE — Op Note (Signed)
PRE-OPERATIVE DIAGNOSIS:  Uterine Fibroids  [address]  POST-OPERATIVE DIAGNOSIS:  Uterine Fibroids ( specimen weight 952 gm)  PROCEDURE:  Abdominal Myomectomy   SURGEON: Elveria Royals, MD  ASSISTANT:  Artelia Laroche, CNM  ANESTHESIA:  General endotracheal  EBL: 75 cc  IVF: 1300 cc LR   Urine output: urine in foley 75 cc , clear   LOCAL MEDICATIONS USED:  MARCAINE 0.25% 30 cc skin infiltration     SPECIMEN:   4 fibroids to pathology  COUNTS:  YES  PATIENT DISPOSITION:  PACU - hemodynamically stable. Then admit for post-op care.   Delay start of Pharmacological VTE agent (>24hrs) due to surgical blood loss or risk of bleeding: yes  PROCEDURE:   Indication:  Symptomatic uterine fibroids with abdominal pressure, pain, menorrhagia, anemia. After reviewing ultrasound and fibroid depth, decision was made for abdominal myomectomy. Risks and complications of surgery including infection, bleeding, damage to internal organs and other including but not limited to surgery related problems including pneumonia, VTE reviewed. Informed written consent was obtained.   Patient was brought to the operating room with IV running. She received 2 gm Ancef. Underwent general anesthesia without difficulty and was given dorsal supine position, prepped and draped in sterile fashion. Foley catheter was placed. Pfannenstiel incision was made with scalpel and carried down to the underlying fascia with Bovie with excellent hemostasis.  Fascia incised and extended laterally. Fascia grasped with Kocher's and underlying rectus muscles were dissected down. Rectus muscles were separated in midline. Posterior rectus sheath and posterior peritoneum was grasped with mosquitoes and peritoneal entry made and extended.  Large fibroid uterus was palpated. No adhesions noted. Ovaries and tubes were normal. Medium Alexis retractor placed and bowel loops packed with moist lap sponge. Uterus palpated. Large anterior fibroid was  exposed using Deaver to support the uterus. It was encompassing the entire anterior wall. Anterior surface of uterine wall was infiltrated with dilute vasopressin. Vertical incision made on serosa with needle tip cautery and carried down through the myometrium to reach the fibroid. Myometrial edges grasped and fibroid grasped with towel clips and fibroid was enucleated. Hemostasis obtained with cautery. A small subserosal myoma from its inferior edge was removed. Myoma bed was approximated with 0-Vicryl in 2 layers and the serosal layer approximated with 2-0 Monocryl, by baseball stitch. Hemostasis noted. Now posterior left myoma was identified, situated at the uterine cornua, behind proximal end of left fallopian tube and posterior to left utero-ovarian ligament. So decision was made to stay closed to uterine midline. Serosa infiltrated with dilute vasopressin and serosa incised with cautery. Fibroid identified and grasped with towel clamps and enucleated. Hemostasis obtained. Myoma bed was approximated with 0-vicryl in 2 layers and serosa with 2-0 Monocryl- by baseball stitch. Last was a superficial subserosal right posterior myoma. It was enucleated in similar fashion and myoma bed was closed in single layer followed by serosa.  Irrigation done. Hemostasis excellent. Interceed placed on all 3 incisions. Alexis retractor removed. Interceed remained well attached to the incision sites on uterus. .  Peritoneal edges grasped and peritoneum closed with 2-0 Vicryl. Fascia sutured with 0 Vicryl from two ends and met in midline. Subcutaneous layer was deep and closed with 2-0 Plain gut. Skin approximated with 4-0 Vicryl in subcuticular fashion.  Sterile dressing placed.  Sterile Honeycomb dressing placed.  All  Instruments/ lap/ sponges counts were correct x2. No complications. Patient brought to PACU.  Findings discussed with her mother.  Dr Benjie Karvonen was the surgeon for entire case.   -  V.Demerius Podolak, MD

## 2017-09-15 NOTE — H&P (Signed)
Shannon Carpenter is an 32 y.o. female. G1P1 with fibroid uterus with menorrhagia, anemia.  Desires childbearing option.  Nl Pap hx.  Office sono - enlarged fibroid uterus 12x12x10 cm. Largest myoma 7 cm transmural with endometrial displacement, two others  Are 5 cm, right and left subserosal.   Med/Surg/Fam Hx/ allergies reviewed   Patient's last menstrual period was 09/09/2017 (exact date).    Past Medical History:  Diagnosis Date  . Anemia     No past surgical history on file.  No family history on file.  Social History:  reports that she has never smoked. She has never used smokeless tobacco. She reports that she drinks alcohol. She reports that she does not use drugs.  Allergies: No Known Allergies  Medications Prior to Admission  Medication Sig Dispense Refill Last Dose  . cholecalciferol (VITAMIN D) 1000 units tablet Take 1,000 Units by mouth daily.   Past Week at Unknown time  . ferrous sulfate 325 (65 FE) MG EC tablet Take 325 mg by mouth daily.   Past Week at Unknown time  . Garlic 0737 MG TBEC Take 2,000 mg by mouth 2 (two) times daily.   Past Week at Unknown time    ROS none   Physical Exam BP 134/86   Pulse 84   Temp 98.3 F (36.8 C) (Oral)   Resp 16   LMP 09/09/2017 (Exact Date)   SpO2 100%  Physical exam:  A&O x 3, no acute distress. Pleasant HEENT neg, no thyromegaly Lungs CTA bilat CV RRR, S1S2 normal Abdo soft, non tender, non acute Extr no edema/ tenderness Pelvic enlarged uterus 12-14 wk size   CBC    Component Value Date/Time   WBC 4.3 09/12/2017 1050   RBC 4.18 09/12/2017 1050   HGB 11.6 (L) 09/12/2017 1050   HCT 35.7 (L) 09/12/2017 1050   PLT 304 09/12/2017 1050   MCV 85.4 09/12/2017 1050   MCH 27.8 09/12/2017 1050   MCHC 32.5 09/12/2017 1050   RDW 18.9 (H) 09/12/2017 1050    Assessment/Plan: 32 yo here for abdominal myomectomy. After counseling open route was decided due to the central large fibroid dividing the uterus in 2 thin  myometrium sides and submucosal component   Risks/complications of surgery reviewed incl infection, bleeding, damage to internal organs including bladder, bowels, ureters, blood vessels, other risks from anesthesia, VTE and delayed complications of any surgery, complications in future surgery reviewed.  Risk of adhesions, infertility and need for C/s in pregnancy at 36-37 wks reviewed.  Informed written consent obtained.   Elveria Royals 09/15/2017, 11:36 AM

## 2017-09-15 NOTE — Transfer of Care (Signed)
Immediate Anesthesia Transfer of Care Note  Patient: Shannon Carpenter  Procedure(s) Performed: Abdominal MYOMECTOMY (N/A Abdomen)  Patient Location: PACU  Anesthesia Type:General  Level of Consciousness: awake, alert  and oriented  Airway & Oxygen Therapy: Patient Spontanous Breathing and Patient connected to nasal cannula oxygen  Post-op Assessment: Report given to RN and Post -op Vital signs reviewed and stable  Post vital signs: Reviewed and stable  Last Vitals:  Vitals Value Taken Time  BP    Temp    Pulse    Resp    SpO2      Last Pain:  Vitals:   09/15/17 1131  TempSrc: Oral  PainSc: 1       Patients Stated Pain Goal: 5 (50/01/64 2903)  Complications: No apparent anesthesia complications

## 2017-09-15 NOTE — Anesthesia Procedure Notes (Signed)
Procedure Name: Intubation Date/Time: 09/15/2017 1:17 PM Performed by: Annye Asa, MD Pre-anesthesia Checklist: Patient identified, Patient being monitored, Timeout performed, Emergency Drugs available and Suction available Patient Re-evaluated:Patient Re-evaluated prior to induction Oxygen Delivery Method: Circle System Utilized Preoxygenation: Pre-oxygenation with 100% oxygen Induction Type: IV induction Ventilation: Mask ventilation without difficulty Laryngoscope Size: Miller and 2 Grade View: Grade II Tube type: Oral Tube size: 7.0 mm Number of attempts: 1 Airway Equipment and Method: stylet Placement Confirmation: ETT inserted through vocal cords under direct vision,  positive ETCO2 and breath sounds checked- equal and bilateral Secured at: 22 cm Tube secured with: Tape Dental Injury: Teeth and Oropharynx as per pre-operative assessment

## 2017-09-15 NOTE — Anesthesia Preprocedure Evaluation (Signed)
Anesthesia Evaluation  Patient identified by MRN, date of birth, ID band Patient awake    Reviewed: Allergy & Precautions, NPO status , Patient's Chart, lab work & pertinent test results  History of Anesthesia Complications Negative for: history of anesthetic complications  Airway Mallampati: II  TM Distance: >3 FB Neck ROM: Full    Dental  (+) Dental Advisory Given, Teeth Intact   Pulmonary neg pulmonary ROS,    breath sounds clear to auscultation       Cardiovascular negative cardio ROS   Rhythm:Regular Rate:Normal     Neuro/Psych negative neurological ROS  negative psych ROS   GI/Hepatic negative GI ROS, Neg liver ROS,   Endo/Other  obese  Renal/GU negative Renal ROS     Musculoskeletal   Abdominal (+) + obese,   Peds  Hematology negative hematology ROS (+)   Anesthesia Other Findings   Reproductive/Obstetrics                             Anesthesia Physical Anesthesia Plan  ASA: II  Anesthesia Plan: General   Post-op Pain Management:    Induction: Intravenous  PONV Risk Score and Plan: 4 or greater and Scopolamine patch - Pre-op, Dexamethasone and Ondansetron  Airway Management Planned: Oral ETT  Additional Equipment:   Intra-op Plan:   Post-operative Plan: Extubation in OR  Informed Consent: I have reviewed the patients History and Physical, chart, labs and discussed the procedure including the risks, benefits and alternatives for the proposed anesthesia with the patient or authorized representative who has indicated his/her understanding and acceptance.   Dental advisory given  Plan Discussed with: CRNA and Surgeon  Anesthesia Plan Comments: (Plan routine monitors, GETA)        Anesthesia Quick Evaluation

## 2017-09-15 NOTE — Anesthesia Postprocedure Evaluation (Signed)
Anesthesia Post Note  Patient: Shannon Carpenter  Procedure(s) Performed: Abdominal MYOMECTOMY (N/A Abdomen)     Patient location during evaluation: [facility] Anesthesia Type: General Level of consciousness: awake and alert and oriented Pain management: pain level controlled Respiratory status: spontaneous breathing, patient connected to nasal cannula oxygen, nonlabored ventilation and respiratory function stable Cardiovascular status: stable Postop Assessment: no apparent nausea or vomiting and adequate PO intake Anesthetic complications: no    Last Vitals:  Vitals:   09/15/17 1642 09/15/17 1700  BP: 125/78 119/73  Pulse: 84 81  Resp: 16 16  Temp: 36.6 C 36.9 C  SpO2: 95% 98%    Last Pain:  Vitals:   09/15/17 1716  TempSrc:   PainSc: 2    Pain Goal: Patients Stated Pain Goal: 5 (09/15/17 1630)               Willa Rough

## 2017-09-15 NOTE — Anesthesia Postprocedure Evaluation (Signed)
Anesthesia Post Note  Patient: Shannon Carpenter  Procedure(s) Performed: Abdominal MYOMECTOMY (N/A Abdomen)     Patient location during evaluation: PACU Anesthesia Type: General Level of consciousness: awake and alert, oriented and patient cooperative Pain management: pain level controlled Vital Signs Assessment: post-procedure vital signs reviewed and stable Respiratory status: spontaneous breathing, nonlabored ventilation and respiratory function stable Cardiovascular status: blood pressure returned to baseline and stable Postop Assessment: no apparent nausea or vomiting Anesthetic complications: no    Last Vitals:  Vitals:   09/15/17 1621 09/15/17 1628  BP:    Pulse: (!) 102 98  Resp: 16 19  Temp:    SpO2: 96% 95%    Last Pain:  Vitals:   09/15/17 1621  TempSrc:   PainSc: 6    Pain Goal: Patients Stated Pain Goal: 5 (09/15/17 1621)               Seleta Rhymes. Neomia Herbel

## 2017-09-16 ENCOUNTER — Encounter (HOSPITAL_COMMUNITY): Payer: Self-pay | Admitting: Obstetrics & Gynecology

## 2017-09-16 DIAGNOSIS — D252 Subserosal leiomyoma of uterus: Secondary | ICD-10-CM | POA: Diagnosis not present

## 2017-09-16 LAB — CBC
HCT: 30.4 % — ABNORMAL LOW (ref 36.0–46.0)
HEMOGLOBIN: 10.2 g/dL — AB (ref 12.0–15.0)
MCH: 28.7 pg (ref 26.0–34.0)
MCHC: 33.6 g/dL (ref 30.0–36.0)
MCV: 85.6 fL (ref 78.0–100.0)
PLATELETS: 280 10*3/uL (ref 150–400)
RBC: 3.55 MIL/uL — AB (ref 3.87–5.11)
RDW: 18.4 % — ABNORMAL HIGH (ref 11.5–15.5)
WBC: 11 10*3/uL — ABNORMAL HIGH (ref 4.0–10.5)

## 2017-09-16 MED ORDER — IBUPROFEN 600 MG PO TABS: 600.0000 mg | ORAL_TABLET | Freq: Four times a day (QID) | ORAL | 0 refills | Status: DC | PRN

## 2017-09-16 MED ORDER — OXYCODONE-ACETAMINOPHEN 5-325 MG PO TABS: 1.0000 | ORAL_TABLET | Freq: Four times a day (QID) | ORAL | 0 refills | Status: DC | PRN

## 2017-09-16 NOTE — Progress Notes (Signed)
Pt discharged to home with family.  Condition stable.  Pt to car via wheelchair with Astrid Divine, NT.  No equipment for home ordered at discharge.

## 2017-09-16 NOTE — Progress Notes (Signed)
1 Day Post-Op Procedure(s) (LRB): Abdominal MYOMECTOMY (N/A)  Subjective: Patient reports tolerating PO, + flatus and no problems voiding.    Objective: I have reviewed patient's vital signs, intake and output, medications and labs. BP 119/78   Pulse 84   Temp 98.5 F (36.9 C)   Resp 18   Ht 5\' 1"  (1.549 m)   Wt 183 lb (83 kg)   LMP 09/09/2017 (Exact Date)   SpO2 100%   BMI 34.58 kg/m   General: alert and cooperative Resp: clear to auscultation bilaterally Cardio: regular rate and rhythm, S1, S2 normal, no murmur, click, rub or gallop GI: soft, non-tender; bowel sounds normal; no masses,  no organomegaly Extremities: extremities normal, atraumatic, no cyanosis or edema  CBC    Component Value Date/Time   WBC 11.0 (H) 09/16/2017 0534   RBC 3.55 (L) 09/16/2017 0534   HGB 10.2 (L) 09/16/2017 0534   HCT 30.4 (L) 09/16/2017 0534   PLT 280 09/16/2017 0534   MCV 85.6 09/16/2017 0534   MCH 28.7 09/16/2017 0534   MCHC 33.6 09/16/2017 0534   RDW 18.4 (H) 09/16/2017 0534   Assessment: s/p Procedure(s): Abdominal MYOMECTOMY (N/A): stable, progressing well, tolerating diet and pain controlled.   Plan: Discharge home  LOS: 1 day    Shannon Carpenter 09/16/2017, 12:54 PM

## 2017-09-16 NOTE — Discharge Instructions (Signed)
Myomectomy, Care After °Refer to this sheet in the next few weeks. These instructions provide you with information on caring for yourself after your procedure. Your health care provider may also give you more specific instructions. Your treatment has been planned according to current medical practices, but problems sometimes occur. Call your health care provider if you have any problems or questions after your procedure. °What can I expect after the procedure? °After your procedure, it is typical to have the following: °· Pain in your abdomen, especially at any incision sites. You will be given pain medicine to control the pain. °· Tiredness. This is a normal part of the recovery process. Your energy level will return to normal over the next several weeks. °· Constipation. °· Vaginal bleeding. This is normal and should stop after 1-2 weeks. ° °Follow these instructions at home: °· Only take over-the-counter or prescription medicines as directed by your health care provider. Avoid aspirin because it can cause bleeding. °· Do not douche, use tampons, or have sexual intercourse until given permission by your health care provider. °· Remove or change any bandages (dressings) as directed by your health care provider. °· Take showers instead of baths as directed by your health care provider. °· You will probably be able to go back to your normal routine after a few days. Do not do anything that requires extra effort until your health care provider says it is okay. Do not lift anything heavier than 15 pounds (6.8 kg) until your health care provider approves. °· Walk daily but take frequent rest breaks if you tire easily. °· Continue to practice deep breathing and coughing. If it hurts to cough, try holding a pillow against your belly as you cough. °· If you become constipated, you may: °? Use a mild laxative if your health care provider approves. °? Add more fruit and bran to your diet. °? Drink enough fluids to keep your  urine clear or pale yellow. °· Take your temperature twice a day and write it down. °· Do not drink alcohol. °· Do not drive until your health care provider approves. °· Have someone help you at home for 1 week or until you can do your own household activities. °· Follow up with your health care provider as directed. °Contact a health care provider if: °· You have a fever. °· You have increasing abdominal pain that is not relieved with medicine. °· You have nausea, vomiting, or diarrhea. °· You have pain when you urinate, or you have blood in your urine. °· You have a rash on your body. °· You have pain or redness where your IV access tube was inserted. °· You have redness, swelling, or any kind of drainage from an incision. °Get help right away if: °· You have weakness or lightheadedness. °· You have pain, swelling, or redness in your legs. °· You have chest pain. °· You faint. °· You have shortness of breath. °· You have heavy vaginal bleeding. °· Your incision is opening up. °This information is not intended to replace advice given to you by your health care provider. Make sure you discuss any questions you have with your health care provider. °Document Released: 07/04/2010 Document Revised: 07/20/2015 Document Reviewed: 09/23/2012 °Elsevier Interactive Patient Education © 2017 [employer] ° °

## 2017-09-16 NOTE — Discharge Summary (Signed)
Physician Discharge Summary  Patient ID: Shannon Carpenter MRN: [id number] DOB/AGE: 32/12/1985 32 y.o.  Admit date: 09/15/2017 Discharge date: 09/16/2017  Admission Diagnoses: Fibroids   Discharge Diagnoses:  Active Problems:   S/P myomectomy  Discharged Condition: good  Hospital Course: Uncomplicated surgery and overnight recovery. Ambulating, tolerating regular diet, pain well controlled, voiding well. Vitals normal, exam normal, active bowel sounds, incision CDI. Extr no c/c/e.  CBC    Component Value Date/Time   WBC 11.0 (H) 09/16/2017 0534   RBC 3.55 (L) 09/16/2017 0534   HGB 10.2 (L) 09/16/2017 0534   HCT 30.4 (L) 09/16/2017 0534   PLT 280 09/16/2017 0534   MCV 85.6 09/16/2017 0534   MCH 28.7 09/16/2017 0534   MCHC 33.6 09/16/2017 0534   RDW 18.4 (H) 09/16/2017 0534   Disposition: Discharge disposition: 01-Home or Self Care       Discharge Instructions    Call MD for:  difficulty breathing, headache or visual disturbances   Complete by:  As directed    Call MD for:  hives   Complete by:  As directed    Call MD for:  persistant dizziness or light-headedness   Complete by:  As directed    Call MD for:  persistant nausea and vomiting   Complete by:  As directed    Call MD for:  redness, tenderness, or signs of infection (pain, swelling, redness, odor or green/yellow discharge around incision site)   Complete by:  As directed    Call MD for:  severe uncontrolled pain   Complete by:  As directed    Call MD for:  temperature >100.4   Complete by:  As directed    Diet - low sodium heart healthy   Complete by:  As directed    Driving Restrictions   Complete by:  As directed    2 weeks   Increase activity slowly   Complete by:  As directed    Lifting restrictions   Complete by:  As directed    25 lbs 6 weeks   Sexual Activity Restrictions   Complete by:  As directed    6 weeks     Allergies as of 09/16/2017   No Known Allergies     Medication List     TAKE these medications   cholecalciferol 1000 units tablet Commonly known as:  VITAMIN D Take 1,000 Units by mouth daily.   ferrous sulfate 325 (65 FE) MG EC tablet Take 325 mg by mouth daily.   Garlic 8242 MG Tbec Take 2,000 mg by mouth 2 (two) times daily.   ibuprofen 600 MG tablet Commonly known as:  ADVIL,MOTRIN Take 1 tablet (600 mg total) by mouth every 6 (six) hours as needed for mild pain or moderate pain.   oxyCODONE-acetaminophen 5-325 MG tablet Commonly known as:  PERCOCET/ROXICET Take 1 tablet by mouth every 6 (six) hours as needed for moderate pain.        SignedElveria Royals 09/16/2017, 1:15 PM

## 2019-11-03 ENCOUNTER — Encounter (HOSPITAL_COMMUNITY): Payer: Self-pay | Admitting: Obstetrics

## 2019-11-03 ENCOUNTER — Other Ambulatory Visit: Payer: Self-pay

## 2019-11-03 ENCOUNTER — Inpatient Hospital Stay (HOSPITAL_COMMUNITY)
Admission: AD | Admit: 2019-11-03 | Discharge: 2019-11-03 | Disposition: A | Discharge status: Home / Self Care | Payer: BC Managed Care – PPO | Attending: Obstetrics | Admitting: Obstetrics

## 2019-11-03 ENCOUNTER — Other Ambulatory Visit: Payer: Self-pay | Admitting: Obstetrics and Gynecology

## 2019-11-03 DIAGNOSIS — O26891 Other specified pregnancy related conditions, first trimester: Secondary | ICD-10-CM | POA: Diagnosis present

## 2019-11-03 HISTORY — DX: Benign neoplasm of connective and other soft tissue, unspecified: D21.9

## 2019-11-03 HISTORY — DX: Dermatitis, unspecified: L30.9

## 2019-11-03 LAB — COMPREHENSIVE METABOLIC PANEL
ALT: 20 U/L (ref 0–44)
AST: 22 U/L (ref 15–41)
Albumin: 3.4 g/dL — ABNORMAL LOW (ref 3.5–5.0)
Alkaline Phosphatase: 55 U/L (ref 38–126)
Anion gap: 7 (ref 5–15)
BUN: 7 mg/dL (ref 6–20)
CO2: 27 mmol/L (ref 22–32)
Calcium: 9.2 mg/dL (ref 8.9–10.3)
Chloride: 106 mmol/L (ref 98–111)
Creatinine, Ser: 0.8 mg/dL (ref 0.44–1.00)
GFR calc Af Amer: >60 mL/min (ref >60)
GFR calc non Af Amer: >60 mL/min (ref >60)
Glucose, Bld: 106 mg/dL — ABNORMAL HIGH (ref 70–99)
Potassium: 4 mmol/L (ref 3.5–5.1)
Sodium: 140 mmol/L (ref 135–145)
Total Bilirubin: 0.2 mg/dL — ABNORMAL LOW (ref 0.3–1.2)
Total Protein: 6.7 g/dL (ref 6.5–8.1)

## 2019-11-03 LAB — CBC WITH DIFFERENTIAL/PLATELET
Abs Immature Granulocytes: 0.02 K/uL (ref 0.00–0.07)
Basophils Absolute: 0 K/uL (ref 0.0–0.1)
Basophils Relative: 1 %
Eosinophils Absolute: 0 K/uL (ref 0.0–0.5)
Eosinophils Relative: 1 %
HCT: 36.7 % (ref 36.0–46.0)
Hemoglobin: 12 g/dL (ref 12.0–15.0)
Immature Granulocytes: 0 %
Lymphocytes Relative: 45 %
Lymphs Abs: 3 K/uL (ref 0.7–4.0)
MCH: 28.4 pg (ref 26.0–34.0)
MCHC: 32.7 g/dL (ref 30.0–36.0)
MCV: 86.8 fL (ref 80.0–100.0)
Monocytes Absolute: 0.6 K/uL (ref 0.1–1.0)
Monocytes Relative: 9 %
Neutro Abs: 2.8 K/uL (ref 1.7–7.7)
Neutrophils Relative %: 44 %
Platelets: 328 K/uL (ref 150–400)
RBC: 4.23 MIL/uL (ref 3.87–5.11)
RDW: 12.6 % (ref 11.5–15.5)
WBC: 6.4 K/uL (ref 4.0–10.5)
nRBC: 0 % (ref 0.0–0.2)

## 2019-11-03 LAB — HCG, QUANTITATIVE, PREGNANCY: hCG, Beta Chain, Quant, S: 2127 m[IU]/mL — ABNORMAL HIGH (ref <5)

## 2019-11-03 MED ORDER — METHOTREXATE FOR ECTOPIC PREGNANCY
50.0000 mg/m2 | Freq: Once | INTRAMUSCULAR | Status: AC
  Administered 2019-11-03: 100 mg via INTRAMUSCULAR
  Filled 2019-11-03: qty 1

## 2019-11-03 MED ORDER — METHOTREXATE FOR ECTOPIC PREGNANCY: 50.0000 mg/m2 | Freq: Once | INTRAMUSCULAR | Status: DC

## 2019-11-03 NOTE — Progress Notes (Signed)
Pt voices understanding of s/s to report and for which to return to MAU. Will return to MAU Sat for repeat blood work or sooner for concerns. Has handout for Ectopic pregnancy with MTX and foods with high folate which should be avoided while being treated. Pt tol MTX well and d/c home at 1952

## 2019-11-03 NOTE — MAU Note (Signed)
Sent over for Methotrexate.  Denies pain, has some discomfort at times, ? Gas. Is having some vaginal bleeding- just when she wipes, is now red.

## 2019-11-06 ENCOUNTER — Inpatient Hospital Stay (HOSPITAL_COMMUNITY)
Admission: AD | Admit: 2019-11-06 | Discharge: 2019-11-06 | Disposition: A | Discharge status: Home / Self Care | Payer: BC Managed Care – PPO | Attending: Obstetrics and Gynecology | Admitting: Obstetrics and Gynecology

## 2019-11-06 ENCOUNTER — Other Ambulatory Visit (HOSPITAL_COMMUNITY)
Admission: RE | Admit: 2019-11-06 | Discharge: 2019-11-06 | Disposition: A | Discharge status: Home / Self Care | Payer: BC Managed Care – PPO | Source: Home / Self Care | Attending: Obstetrics and Gynecology | Admitting: Obstetrics and Gynecology

## 2019-11-06 DIAGNOSIS — Z3A00 Weeks of gestation of pregnancy not specified: Secondary | ICD-10-CM | POA: Insufficient documentation

## 2019-11-06 DIAGNOSIS — O009 Unspecified ectopic pregnancy without intrauterine pregnancy: Secondary | ICD-10-CM | POA: Diagnosis present

## 2019-11-06 LAB — HCG, QUANTITATIVE, PREGNANCY: hCG, Beta Chain, Quant, S: 5207 m[IU]/mL — ABNORMAL HIGH (ref <5)

## 2019-11-15 ENCOUNTER — Other Ambulatory Visit: Payer: Self-pay | Admitting: Obstetrics & Gynecology

## 2019-11-19 ENCOUNTER — Other Ambulatory Visit: Payer: Self-pay | Admitting: Obstetrics & Gynecology

## 2019-11-19 ENCOUNTER — Encounter (HOSPITAL_COMMUNITY): Payer: Self-pay | Admitting: Obstetrics & Gynecology

## 2019-11-19 ENCOUNTER — Ambulatory Visit (HOSPITAL_COMMUNITY): Payer: BC Managed Care – PPO | Admitting: Certified Registered"

## 2019-11-19 ENCOUNTER — Encounter (HOSPITAL_COMMUNITY)
Admission: RE | Disposition: A | Discharge status: Home / Self Care | Payer: Self-pay | Source: Home / Self Care | Attending: Obstetrics & Gynecology

## 2019-11-19 ENCOUNTER — Ambulatory Visit (HOSPITAL_COMMUNITY)
Admission: RE | Admit: 2019-11-19 | Discharge: 2019-11-19 | Disposition: A | Discharge status: Home / Self Care | Payer: BC Managed Care – PPO | Attending: Obstetrics & Gynecology | Admitting: Obstetrics & Gynecology

## 2019-11-19 ENCOUNTER — Other Ambulatory Visit: Payer: Self-pay

## 2019-11-19 DIAGNOSIS — L309 Dermatitis, unspecified: Secondary | ICD-10-CM | POA: Diagnosis not present

## 2019-11-19 DIAGNOSIS — O00102 Left tubal pregnancy without intrauterine pregnancy: Secondary | ICD-10-CM | POA: Diagnosis present

## 2019-11-19 DIAGNOSIS — Z20822 Contact with and (suspected) exposure to covid-19: Secondary | ICD-10-CM | POA: Diagnosis not present

## 2019-11-19 DIAGNOSIS — O009 Unspecified ectopic pregnancy without intrauterine pregnancy: Secondary | ICD-10-CM

## 2019-11-19 DIAGNOSIS — Z79899 Other long term (current) drug therapy: Secondary | ICD-10-CM | POA: Diagnosis not present

## 2019-11-19 HISTORY — PX: LAPAROSCOPIC SALPINGO OOPHERECTOMY: SHX5927

## 2019-11-19 LAB — RESPIRATORY PANEL BY RT PCR (FLU A&B, COVID)
Influenza A by PCR: NEGATIVE
Influenza B by PCR: NEGATIVE
SARS Coronavirus 2 by RT PCR: NEGATIVE

## 2019-11-19 LAB — TYPE AND SCREEN
ABO/RH(D): O POS
Antibody Screen: NEGATIVE

## 2019-11-19 LAB — CBC
HCT: 38.5 % (ref 36.0–46.0)
Hemoglobin: 11.9 g/dL — ABNORMAL LOW (ref 12.0–15.0)
MCH: 28.1 pg (ref 26.0–34.0)
MCHC: 30.9 g/dL (ref 30.0–36.0)
MCV: 91 fL (ref 80.0–100.0)
Platelets: 288 10*3/uL (ref 150–400)
RBC: 4.23 MIL/uL (ref 3.87–5.11)
RDW: 12.5 % (ref 11.5–15.5)
WBC: 5.1 10*3/uL (ref 4.0–10.5)
nRBC: 0 % (ref 0.0–0.2)

## 2019-11-19 SURGERY — SALPINGO-OOPHORECTOMY, LAPAROSCOPIC
Anesthesia: General | Site: Abdomen | Laterality: Left

## 2019-11-19 MED ORDER — PROPOFOL 10 MG/ML IV BOLUS
INTRAVENOUS | Status: DC | PRN
  Administered 2019-11-19: 130 mg via INTRAVENOUS

## 2019-11-19 MED ORDER — OXYCODONE HCL 5 MG PO TABS
ORAL_TABLET | ORAL | Status: DC
Start: 2019-11-19 — End: 2019-11-20
  Filled 2019-11-19: qty 1

## 2019-11-19 MED ORDER — DEXAMETHASONE SODIUM PHOSPHATE 10 MG/ML IJ SOLN
INTRAMUSCULAR | Status: DC | PRN
  Administered 2019-11-19: 5 mg via INTRAVENOUS

## 2019-11-19 MED ORDER — SUGAMMADEX SODIUM 200 MG/2ML IV SOLN
INTRAVENOUS | Status: DC | PRN
  Administered 2019-11-19: 200 mg via INTRAVENOUS

## 2019-11-19 MED ORDER — MIDAZOLAM HCL 2 MG/2ML IJ SOLN
INTRAMUSCULAR | Status: DC | PRN
  Administered 2019-11-19: 2 mg via INTRAVENOUS

## 2019-11-19 MED ORDER — OXYCODONE-ACETAMINOPHEN 5-325 MG PO TABS
1.0000 | ORAL_TABLET | Freq: Four times a day (QID) | ORAL | 0 refills | Status: AC | PRN
Start: 2019-11-19 — End: 2019-11-22

## 2019-11-19 MED ORDER — LIDOCAINE 2% (20 MG/ML) 5 ML SYRINGE
INTRAMUSCULAR | Status: DC | PRN
  Administered 2019-11-19: 100 mg via INTRAVENOUS

## 2019-11-19 MED ORDER — HYDROMORPHONE HCL 1 MG/ML IJ SOLN: 0.2500 mg | INTRAMUSCULAR | Status: DC | PRN

## 2019-11-19 MED ORDER — ACETAMINOPHEN 325 MG PO TABS: 650.0000 mg | ORAL_TABLET | Freq: Four times a day (QID) | ORAL | Status: AC | PRN

## 2019-11-19 MED ORDER — ONDANSETRON HCL 4 MG/2ML IJ SOLN: 4.0000 mg | Freq: Once | INTRAMUSCULAR | Status: DC | PRN

## 2019-11-19 MED ORDER — KETOROLAC TROMETHAMINE 30 MG/ML IJ SOLN
INTRAMUSCULAR | Status: DC | PRN
  Administered 2019-11-19: 30 mg via INTRAVENOUS

## 2019-11-19 MED ORDER — PHENYLEPHRINE 40 MCG/ML (10ML) SYRINGE FOR IV PUSH (FOR BLOOD PRESSURE SUPPORT)
PREFILLED_SYRINGE | INTRAVENOUS | Status: DC | PRN
  Administered 2019-11-19: 80 ug via INTRAVENOUS

## 2019-11-19 MED ORDER — SUCCINYLCHOLINE CHLORIDE 200 MG/10ML IV SOSY
PREFILLED_SYRINGE | INTRAVENOUS | Status: AC
  Filled 2019-11-19: qty 10

## 2019-11-19 MED ORDER — ROCURONIUM BROMIDE 10 MG/ML (PF) SYRINGE
PREFILLED_SYRINGE | INTRAVENOUS | Status: AC
  Filled 2019-11-19: qty 10

## 2019-11-19 MED ORDER — MEPERIDINE HCL 25 MG/ML IJ SOLN: 6.2500 mg | INTRAMUSCULAR | Status: DC | PRN

## 2019-11-19 MED ORDER — ONDANSETRON HCL 4 MG/2ML IJ SOLN
INTRAMUSCULAR | Status: DC | PRN
  Administered 2019-11-19: 4 mg via INTRAVENOUS

## 2019-11-19 MED ORDER — POVIDONE-IODINE 10 % EX SWAB: 2.0000 "application " | Freq: Once | CUTANEOUS | Status: DC

## 2019-11-19 MED ORDER — DEXAMETHASONE SODIUM PHOSPHATE 10 MG/ML IJ SOLN
INTRAMUSCULAR | Status: AC
  Filled 2019-11-19: qty 1

## 2019-11-19 MED ORDER — ONDANSETRON HCL 4 MG/2ML IJ SOLN
INTRAMUSCULAR | Status: AC
  Filled 2019-11-19: qty 2

## 2019-11-19 MED ORDER — LACTATED RINGERS IV SOLN: INTRAVENOUS | Status: DC

## 2019-11-19 MED ORDER — BUPIVACAINE HCL (PF) 0.25 % IJ SOLN
INTRAMUSCULAR | Status: AC
  Filled 2019-11-19: qty 30

## 2019-11-19 MED ORDER — CHLORHEXIDINE GLUCONATE 0.12 % MT SOLN
15.0000 mL | OROMUCOSAL | Status: AC
  Filled 2019-11-19: qty 15

## 2019-11-19 MED ORDER — LIDOCAINE 2% (20 MG/ML) 5 ML SYRINGE
INTRAMUSCULAR | Status: AC
  Filled 2019-11-19: qty 5

## 2019-11-19 MED ORDER — MIDAZOLAM HCL 2 MG/2ML IJ SOLN
INTRAMUSCULAR | Status: AC
  Filled 2019-11-19: qty 2

## 2019-11-19 MED ORDER — OXYCODONE HCL 5 MG PO TABS
5.0000 mg | ORAL_TABLET | Freq: Once | ORAL | Status: AC
  Administered 2019-11-19: 5 mg via ORAL

## 2019-11-19 MED ORDER — FENTANYL CITRATE (PF) 250 MCG/5ML IJ SOLN
INTRAMUSCULAR | Status: AC
Start: 2019-11-19 — End: ?
  Filled 2019-11-19: qty 5

## 2019-11-19 MED ORDER — SUCCINYLCHOLINE CHLORIDE 20 MG/ML IJ SOLN
INTRAMUSCULAR | Status: DC | PRN
  Administered 2019-11-19: 180 mg via INTRAVENOUS

## 2019-11-19 MED ORDER — BUPIVACAINE HCL (PF) 0.25 % IJ SOLN
INTRAMUSCULAR | Status: DC | PRN
  Administered 2019-11-19: 14 mL

## 2019-11-19 MED ORDER — ROCURONIUM BROMIDE 10 MG/ML (PF) SYRINGE
PREFILLED_SYRINGE | INTRAVENOUS | Status: DC | PRN
  Administered 2019-11-19: 30 mg via INTRAVENOUS

## 2019-11-19 MED ORDER — CHLORHEXIDINE GLUCONATE 0.12 % MT SOLN
OROMUCOSAL | Status: AC
  Administered 2019-11-19: 15 mL via OROMUCOSAL
  Filled 2019-11-19: qty 15

## 2019-11-19 MED ORDER — FENTANYL CITRATE (PF) 100 MCG/2ML IJ SOLN
INTRAMUSCULAR | Status: DC | PRN
Start: 2019-11-19 — End: 2019-11-19
  Administered 2019-11-19: 100 ug via INTRAVENOUS

## 2019-11-19 MED ORDER — IBUPROFEN 200 MG PO TABS: 600.0000 mg | ORAL_TABLET | Freq: Four times a day (QID) | ORAL | 0 refills | Status: AC | PRN

## 2019-11-19 MED ORDER — KETOROLAC TROMETHAMINE 30 MG/ML IJ SOLN
INTRAMUSCULAR | Status: AC
  Filled 2019-11-19: qty 1

## 2019-11-19 MED ORDER — PROPOFOL 10 MG/ML IV BOLUS
INTRAVENOUS | Status: AC
  Filled 2019-11-19: qty 20

## 2019-11-19 MED ORDER — CEFAZOLIN SODIUM-DEXTROSE 2-4 GM/100ML-% IV SOLN
2.0000 g | INTRAVENOUS | Status: AC
  Administered 2019-11-19: 2 g via INTRAVENOUS
  Filled 2019-11-19: qty 100

## 2019-11-19 SURGICAL SUPPLY — 36 items
ADH SKN CLS APL DERMABOND .7 (GAUZE/BANDAGES/DRESSINGS) ×1
BAG SPEC RTRVL LRG 6X4 10 (ENDOMECHANICALS)
CABLE HIGH FREQUENCY MONO STRZ (ELECTRODE) IMPLANT
CATH ROBINSON RED A/P 16FR (CATHETERS) ×3 IMPLANT
CNTNR URN SCR LID CUP LEK RST (MISCELLANEOUS) IMPLANT
CONT SPEC 4OZ STRL OR WHT (MISCELLANEOUS) ×3
DERMABOND ADVANCED (GAUZE/BANDAGES/DRESSINGS) ×2
DERMABOND ADVANCED .7 DNX12 (GAUZE/BANDAGES/DRESSINGS) ×1 IMPLANT
DRSG OPSITE POSTOP 3X4 (GAUZE/BANDAGES/DRESSINGS) IMPLANT
DURAPREP 26ML APPLICATOR (WOUND CARE) ×3 IMPLANT
FILTER SMOKE EVAC LAPAROSHD (FILTER) IMPLANT
GLOVE BIO SURGEON STRL SZ7 (GLOVE) ×3 IMPLANT
GLOVE BIOGEL PI IND STRL 7.0 (GLOVE) ×2 IMPLANT
GLOVE BIOGEL PI INDICATOR 7.0 (GLOVE) ×4
GOWN STRL REUS W/ TWL LRG LVL3 (GOWN DISPOSABLE) ×3 IMPLANT
GOWN STRL REUS W/TWL LRG LVL3 (GOWN DISPOSABLE) ×9
KIT TURNOVER KIT B (KITS) ×3 IMPLANT
LIGASURE VESSEL 5MM BLUNT TIP (ELECTROSURGICAL) IMPLANT
MANIPULATOR UTERINE 4.5 ZUMI (MISCELLANEOUS) ×3 IMPLANT
NS IRRIG 1000ML POUR BTL (IV SOLUTION) ×3 IMPLANT
PACK LAPAROSCOPY BASIN (CUSTOM PROCEDURE TRAY) ×3 IMPLANT
PACK TRENDGUARD 450 HYBRID PRO (MISCELLANEOUS) IMPLANT
POUCH SPECIMEN RETRIEVAL 10MM (ENDOMECHANICALS) IMPLANT
PROTECTOR NERVE ULNAR (MISCELLANEOUS) ×6 IMPLANT
SCISSORS LAP 5X35 DISP (ENDOMECHANICALS) IMPLANT
SET IRRIG TUBING LAPAROSCOPIC (IRRIGATION / IRRIGATOR) ×4 IMPLANT
SET TUBE SMOKE EVAC HIGH FLOW (TUBING) ×3 IMPLANT
SLEEVE ENDOPATH XCEL 5M (ENDOMECHANICALS) ×3 IMPLANT
SOLUTION ELECTROLUBE (MISCELLANEOUS) IMPLANT
SUT VICRYL 0 UR6 27IN ABS (SUTURE) ×3 IMPLANT
SUT VICRYL 4-0 PS2 18IN ABS (SUTURE) ×3 IMPLANT
TOWEL GREEN STERILE FF (TOWEL DISPOSABLE) ×6 IMPLANT
TRENDGUARD 450 HYBRID PRO PACK (MISCELLANEOUS)
TROCAR BALLN 12MMX100 BLUNT (TROCAR) ×2 IMPLANT
TROCAR XCEL NON-BLD 5MMX100MML (ENDOMECHANICALS) ×3 IMPLANT
WARMER LAPAROSCOPE (MISCELLANEOUS) ×3 IMPLANT

## 2019-11-19 NOTE — Anesthesia Procedure Notes (Signed)
Procedure Name: Intubation Date/Time: 11/19/2019 5:12 PM Performed by: Barrington Ellison, CRNA Pre-anesthesia Checklist: Patient identified, Emergency Drugs available, Suction available and Patient being monitored Patient Re-evaluated:Patient Re-evaluated prior to induction Oxygen Delivery Method: Circle System Utilized Preoxygenation: Pre-oxygenation with 100% oxygen Induction Type: IV induction and Rapid sequence Laryngoscope Size: Mac and 3 Grade View: Grade I Tube type: Oral Tube size: 7.0 mm Number of attempts: 1 Airway Equipment and Method: Stylet and Oral airway Placement Confirmation: ETT inserted through vocal cords under direct vision,  positive ETCO2 and breath sounds checked- equal and bilateral Secured at: 22 cm Tube secured with: Tape Dental Injury: Teeth and Oropharynx as per pre-operative assessment

## 2019-11-19 NOTE — Anesthesia Postprocedure Evaluation (Signed)
Anesthesia Post Note  Patient: Shannon Carpenter  Procedure(s) Performed: LAPAROSCOPIC SALPINGECTOMY WITH REMOVAL OF ECTOPIC PREGNANCY (Left Abdomen)     Patient location during evaluation: PACU Anesthesia Type: General Level of consciousness: awake and alert Pain management: pain level controlled Vital Signs Assessment: post-procedure vital signs reviewed and stable Respiratory status: spontaneous breathing, nonlabored ventilation, respiratory function stable and patient connected to nasal cannula oxygen Cardiovascular status: blood pressure returned to baseline and stable Postop Assessment: no apparent nausea or vomiting Anesthetic complications: no   No complications documented.  Last Vitals:  Vitals:   11/19/19 1915 11/19/19 1930  BP: (!) 135/91 128/76  Pulse: 91 90  Resp: 16 19  Temp:  36.8 C  SpO2: 100% 100%    Last Pain:  Vitals:   11/19/19 1915  TempSrc:   PainSc: 4                  Orean Giarratano DAVID

## 2019-11-19 NOTE — Op Note (Signed)
Preoperative diagnosis: Left tubal ectopic pregnancy with hemoperitoneum Postoperative diagnosis: Same Procedure: Laparoscopic left salpingectomy, lysis of adhesions  Surgeon: Dr Azucena Fallen, MD Assistants: Derrell Lolling, CNM Anesthesia Gen. Endotracheal IV fluids LR  1000 cc LR EBL minimal  50 cc Urine clear  500 cc clear in foley Complications none Disposition PACU and home Specimens Left fallopian tube with ectopic pregnancy  Procedure Patient is 35 yo, G2P1001, with failed methotrexate with enlarging adnexal mass and pelvic fluid and pain. Recommended laparoscopic salpingectomy due to presence of hemoperitoneum. Risk and complications of surgery including infection, bleeding, damage to internal organs, other complications including pneumonia, VTE were reviewed.  Informed written consent was obtained and patient was brought to the operating room with IV running. Timeout was carried out. 2 gm Ancef given.  She underwent general anesthesia without difficulty and was given dorsal lithotomy position. She was prepped and draped in standard fashion and foley was placed. Zumi uterine manipulator placed. 10 mm incision made at the umbilicus after injecting marcaine. Fascia grasped and incised. Peritoneum entered. Hassan canula with balloon placed, balloon inflated. Pneumoperitoneum created.  Hemoperitoneum noted. 2 lower abdominal incisions made after marcaine injection and 5 mm trocars placed under vision. Hemoperitoneum aspirated. Left tube noted with ectopic pregnancy, tube was noted to have ampullary bulge, blood clots were adherent at isthmic portion and was bleeding from fimbria but not ruptured. Left ovary and right tube and ovary appeared normal. Left salpingectomy performed with Ligasure desiccating and transacting. Specimen placed in endobag from central port while visualizing with 5 mm scope from left lower port and endobag removed, specimen removed from it and handed off for pathology.  Hemoperitoneum aspirated, peritoneal cavity irrigated thoroughly. Anterior uterine adhesions to bladder peritoneum were filmy and transected with cold Ligasure.  All ports removed under vision and pneumperitoneum released. Central port fascia closed with 0-Vicryl and all skin incisions closed with 4-0 Vicryl and Dermabond applied. Foley and uterine manipulator removed.  All counts were correct x2. Patient to PACU extubated and stable.   Discharge home from recovery room. Surgical findings were discussed with partner. I performed the surgery.  Manon Hilding, MD

## 2019-11-19 NOTE — H&P (Signed)
Shannon Carpenter is an 34 y.o. female here for surgical management of tubal ectopic, left adnexa, office sono noted 2.7 cm left adnexal mass and fluid around and in left adnexa c/w small hemoperitoneum. Pt came for scheduled sono visit as f/up for her ectopic pregnancy diagnosed on 11/03/19 when she received Methotrexate, quant HCG dropped well, down to 1163 today but due to larger mass, mild pain since last night and small free fluid, she was advised laparoscopic left salpingectomy due to very likely possibility of tubal rupture.   9/7 - 1022 (office) 9/8 - 2127 (MAU, MTX), 9/11- 5200, 9/14- 4782 (office, different lab, so close f/up advised), 9/17- 1610 (>20% drop) and 9/24- today 1163  G2P1001, one kid in 2008. New partner since last few yrs. Abdominal myomectomy in 2019. This was unplanned pregnancy and was desiring termination when presented to office with UPT due to "not right time" but was diagnosed with left adnexal ectopic pregnancy that day.  She has had very close follow up, she is following precautions and warning signs, but didn't call last night for mild lower abdominal pain, thinking it was gas pain.   H/o menorrhagia, improved with myomectomy. Nl regular menses, no STI/ PID but myomectomy 2 yrs back, so at risk for adhesions. Desires future pregnancies and declines sterilization.   Patient's last menstrual period was 09/28/2019.    Past Medical History:  Diagnosis Date  . Anemia   . Eczema   . Fibroid     Past Surgical History:  Procedure Laterality Date  . MYOMECTOMY N/A 09/15/2017   Procedure: Abdominal MYOMECTOMY;  Surgeon: Azucena Fallen, MD;  Location: [facility];  Service: Gynecology;  Laterality: N/A;    Family History  Problem Relation Age of Onset  . Healthy Mother   . Cancer Father        colon    Social History:  reports that she has never smoked. She has never used smokeless tobacco. She reports current alcohol use. She reports that she does not use  drugs.  Allergies: No Known Allergies  Medications Prior to Admission  Medication Sig Dispense Refill Last Dose  . acetaminophen (TYLENOL) 325 MG tablet Take 325 mg by mouth daily as needed for headache.   9/22 or 9/23  . hydrocortisone cream 1 % Apply 1 application topically daily as needed (eczema).   11/19/2019 at am    Review of Systems neg  Blood pressure (!) 172/94, pulse (!) 110, temperature 99.2 F (37.3 C), temperature source Oral, resp. rate 18, height 5' 1.5" (1.562 m), weight 92.5 kg, last menstrual period 09/28/2019, SpO2 100 %. Physical Exam Physical exam:  A&O x 3, no acute distress. Pleasant HEENT neg, no thyromegaly Lungs CTA bilat CV RRR, S1S2 normal Abdo soft, non tender, non acute Extr no edema/ tenderness Pelvic deferred due to risk of rupture of ectopic   No results found for this or any previous visit (from the past 24 hour(s)).  No results found.  Assessment/Plan: 34 yo, G2P1001, left adnexal ectopic pregnancy, 2.8 cm mass with some free fluid in pelvis, with concerned for tubal abortion, risk of rutpture of tube with maternal morbidity, proceed with Laparoscopic left salpingectomy despite methotrexate working with today's quant dropping very well.  Risks/complications of surgery reviewed incl infection, bleeding, damage to internal organs including bladder, bowels, ureters, blood vessels, other risks from anesthesia, VTE and delayed complications of any surgery, complications in future surgery reviewed.    Elveria Royals 11/19/2019, 3:43 PM

## 2019-11-19 NOTE — Anesthesia Preprocedure Evaluation (Signed)
Anesthesia Evaluation  Patient identified by MRN, date of birth, ID band Patient awake    Reviewed: Allergy & Precautions, NPO status , Patient's Chart, lab work & pertinent test results  Airway Mallampati: II  TM Distance: >3 FB Neck ROM: Full    Dental   Pulmonary    Pulmonary exam normal        Cardiovascular Normal cardiovascular exam     Neuro/Psych    GI/Hepatic   Endo/Other    Renal/GU      Musculoskeletal   Abdominal   Peds  Hematology   Anesthesia Other Findings   Reproductive/Obstetrics                             Anesthesia Physical Anesthesia Plan  ASA: II and emergent  Anesthesia Plan: General   Post-op Pain Management:    Induction: Intravenous, Rapid sequence and Cricoid pressure planned  PONV Risk Score and Plan: 3 and Ondansetron, Dexamethasone and Midazolam  Airway Management Planned: Oral ETT  Additional Equipment:   Intra-op Plan:   Post-operative Plan: Extubation in OR  Informed Consent: I have reviewed the patients History and Physical, chart, labs and discussed the procedure including the risks, benefits and alternatives for the proposed anesthesia with the patient or authorized representative who has indicated his/her understanding and acceptance.       Plan Discussed with: CRNA and Surgeon  Anesthesia Plan Comments:         Anesthesia Quick Evaluation

## 2019-11-19 NOTE — Transfer of Care (Signed)
Immediate Anesthesia Transfer of Care Note  Patient: Shannon Carpenter  Procedure(s) Performed: LAPAROSCOPIC SALPINGECTOMY WITH REMOVAL OF ECTOPIC PREGNANCY (Left Abdomen)  Patient Location: PACU  Anesthesia Type:General  Level of Consciousness: awake and oriented  Airway & Oxygen Therapy: Patient Spontanous Breathing  Post-op Assessment: Report given to RN  Post vital signs: Reviewed and stable  Last Vitals:  Vitals Value Taken Time  BP 104/63 11/19/19 1833  Temp    Pulse 104 11/19/19 1833  Resp 17 11/19/19 1833  SpO2 99 % 11/19/19 1833  Vitals shown include unvalidated device data.  Last Pain:  Vitals:   11/19/19 1513  TempSrc:   PainSc: 0-No pain         Complications: No complications documented.

## 2019-11-20 ENCOUNTER — Encounter (HOSPITAL_COMMUNITY): Payer: Self-pay | Admitting: Obstetrics & Gynecology

## 2019-11-23 LAB — SURGICAL PATHOLOGY
